# Patient Record
Sex: Male | Born: 1980 | Race: White | Hispanic: No | Marital: Single | Smoking: Never smoker
Health system: Southern US, Community
[De-identification: ages and names within clinical notes are randomized; demographics above are authoritative.]

## PROBLEM LIST (undated history)

## (undated) DIAGNOSIS — F1010 Alcohol abuse, uncomplicated: Secondary | ICD-10-CM

## (undated) DIAGNOSIS — R569 Unspecified convulsions: Secondary | ICD-10-CM

## (undated) HISTORY — PX: BACK SURGERY: SHX140

---

## 2013-05-03 ENCOUNTER — Emergency Department: Payer: Self-pay | Admitting: Emergency Medicine

## 2013-05-03 LAB — URINALYSIS, COMPLETE
Bacteria: NONE SEEN
Blood: NEGATIVE
Leukocyte Esterase: NEGATIVE
Protein: NEGATIVE
RBC,UR: <1 /HPF (ref 0–5)
Specific Gravity: 1.015 (ref 1.003–1.030)
Squamous Epithelial: NONE SEEN
WBC UR: 1 /HPF (ref 0–5)

## 2013-05-03 LAB — COMPREHENSIVE METABOLIC PANEL
Alkaline Phosphatase: 72 U/L (ref 50–136)
Co2: 23 mmol/L (ref 21–32)
Potassium: 3.5 mmol/L (ref 3.5–5.1)
SGOT(AST): 48 U/L — ABNORMAL HIGH (ref 15–37)
SGPT (ALT): 36 U/L (ref 12–78)
Sodium: 143 mmol/L (ref 136–145)

## 2013-05-03 LAB — DRUG SCREEN, URINE
Amphetamines, Ur Screen: NEGATIVE (ref ?–1000)
Benzodiazepine, Ur Scrn: NEGATIVE (ref ?–200)
Cocaine Metabolite,Ur ~~LOC~~: NEGATIVE (ref ?–300)
MDMA (Ecstasy)Ur Screen: NEGATIVE (ref ?–500)
Opiate, Ur Screen: NEGATIVE (ref ?–300)
Phencyclidine (PCP) Ur S: NEGATIVE (ref ?–25)
Tricyclic, Ur Screen: NEGATIVE (ref ?–1000)

## 2013-05-03 LAB — CBC
HCT: 42.7 % (ref 40.0–52.0)
HGB: 14.5 g/dL (ref 13.0–18.0)
MCHC: 33.9 g/dL (ref 32.0–36.0)
Platelet: 179 10*3/uL (ref 150–440)

## 2013-05-03 LAB — TSH: Thyroid Stimulating Horm: 1.82 u[IU]/mL

## 2013-05-03 LAB — ETHANOL: Ethanol: 385 mg/dL

## 2013-05-04 ENCOUNTER — Emergency Department: Payer: Self-pay | Admitting: Emergency Medicine

## 2013-05-04 LAB — COMPREHENSIVE METABOLIC PANEL
Anion Gap: 9 (ref 7–16)
BUN: 12 mg/dL (ref 7–18)
Calcium, Total: 7.6 mg/dL — ABNORMAL LOW (ref 8.5–10.1)
Chloride: 111 mmol/L — ABNORMAL HIGH (ref 98–107)
Co2: 25 mmol/L (ref 21–32)
Creatinine: 0.77 mg/dL (ref 0.60–1.30)
EGFR (African American): >60
EGFR (Non-African Amer.): >60
Glucose: 111 mg/dL — ABNORMAL HIGH (ref 65–99)
Osmolality: 289 (ref 275–301)
Potassium: 3.6 mmol/L (ref 3.5–5.1)
SGOT(AST): 54 U/L — ABNORMAL HIGH (ref 15–37)
Total Protein: 7 g/dL (ref 6.4–8.2)

## 2013-05-04 LAB — DRUG SCREEN, URINE
Benzodiazepine, Ur Scrn: NEGATIVE (ref ?–200)
Cocaine Metabolite,Ur ~~LOC~~: NEGATIVE (ref ?–300)
Opiate, Ur Screen: NEGATIVE (ref ?–300)

## 2013-05-04 LAB — CBC
MCH: 30.2 pg (ref 26.0–34.0)
Platelet: 136 10*3/uL — ABNORMAL LOW (ref 150–440)
RBC: 4.58 10*6/uL (ref 4.40–5.90)
RDW: 14.7 % — ABNORMAL HIGH (ref 11.5–14.5)

## 2013-05-04 LAB — URINALYSIS, COMPLETE
Bacteria: NONE SEEN
Bilirubin,UR: NEGATIVE
Glucose,UR: NEGATIVE mg/dL (ref 0–75)
Ketone: NEGATIVE
Nitrite: NEGATIVE
Squamous Epithelial: NONE SEEN
WBC UR: NONE SEEN /HPF (ref 0–5)

## 2013-05-04 LAB — TSH: Thyroid Stimulating Horm: 2.24 u[IU]/mL

## 2013-05-04 LAB — ETHANOL: Ethanol: 497 mg/dL

## 2013-05-05 LAB — ETHANOL: Ethanol: 297 mg/dL

## 2015-04-19 NOTE — Consult Note (Signed)
PATIENT NAME:  Salomon MastCHEEK, Vane R MR#:  [id number] DATE OF BIRTH:  [birth year]  AGE:  34 years  SEX:  Male  RACE:  White  DATE OF CONSULTATION:  05/06/2013  PLACE OF DICTATION:  [facility] ED, [place]  CONSULTING PHYSICIAN:  Heatherly Stenner K. Rosilyn Coachman, MD  SUBJECTIVE:  The patient was seen in [facility]. The patient is a 34 year old white male, not employed at this time. He is single, and lives with family members and with parents. The patient came on self-recommendation to [facility] Emergency Room with the chief complaint "I want to get over my substance abuse problem, I have been drinking alcohol to get over the pain in my back."   HISTORY OF PRESENT ILLNESS:  The patient reports that he has been having pain in the back, for which he underwent spinal fusion at [facility] in 2008, and still has chronic pain. He has been drinking alcoher the same. The patient reports he has been drinking at the rate of 3 to 4 mixed drinks twice a week to get over the same. On the day of admission, he drank more of the same, which was  "pure alcohol" and that is way his BAL was 497 when he came to the Emergency Room. The patient reports that he is a veteran, and he needs to [facility] [place] [facility] [place] [facility] [place], and is not able to make it because of the distance.    MENTAL STATUS:  Alert and oriented, aware of the situation that brought him to [facility]. Admits feeling low, down, depressed because of his chronic pain in the back, and uses alcohol as a self-prescribed drug. Denies feeling hopeless/helpless. Denies any ideas or plans to hurt himself or others. No psychosis. Denies auditory and visual hallucinations. Denies any ideas  but wants to get help with alcohol dependence and problems related to the same. ioor paln is intact. Insight and judgment guarded.  IMPRESSION:  Alcohol dependence, chronic, continuous. Drinks about 3 to 4 mixed drinks twice a week for more than a month. 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   RECOMMENDATION:  Put patient on CIWA and follow him. 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 The patient is to call the [place] on Monday, 05/08/2013, for appropriate followup and help.   ____________________________ Jannet MantisSurya K. Guss Bundehalla, MD skc:mr D: 05/06/2013 23:27:22 ET T: 05/07/2013 17:50:08 ET JOB#: [id number]  cc: Monika SalkSurya K. Guss [place]unde[place], MD, <Dictator> Beau FannySURYA K Christinamarie Tall MD ELECTRONICALLY SIGNED 05/07/2013 20:02

## 2016-03-12 ENCOUNTER — Emergency Department
Admission: EM | Admit: 2016-03-12 | Discharge: 2016-03-12 | Disposition: A | Discharge status: Home / Self Care | Payer: Self-pay | Attending: Emergency Medicine | Admitting: Emergency Medicine

## 2016-03-12 ENCOUNTER — Encounter: Payer: Self-pay | Admitting: Emergency Medicine

## 2016-03-12 DIAGNOSIS — G4700 Insomnia, unspecified: Secondary | ICD-10-CM | POA: Insufficient documentation

## 2016-03-12 DIAGNOSIS — F419 Anxiety disorder, unspecified: Secondary | ICD-10-CM | POA: Diagnosis present

## 2016-03-12 DIAGNOSIS — F329 Major depressive disorder, single episode, unspecified: Secondary | ICD-10-CM | POA: Insufficient documentation

## 2016-03-12 DIAGNOSIS — M549 Dorsalgia, unspecified: Secondary | ICD-10-CM | POA: Insufficient documentation

## 2016-03-12 DIAGNOSIS — F1010 Alcohol abuse, uncomplicated: Secondary | ICD-10-CM | POA: Diagnosis present

## 2016-03-12 DIAGNOSIS — G40909 Epilepsy, unspecified, not intractable, without status epilepticus: Secondary | ICD-10-CM | POA: Insufficient documentation

## 2016-03-12 DIAGNOSIS — R110 Nausea: Secondary | ICD-10-CM | POA: Insufficient documentation

## 2016-03-12 HISTORY — DX: Unspecified convulsions: R56.9

## 2016-03-12 HISTORY — DX: Alcohol abuse, uncomplicated: F10.10

## 2016-03-12 LAB — COMPREHENSIVE METABOLIC PANEL
ALT: 11 U/L — ABNORMAL LOW (ref 17–63)
ANION GAP: 12 (ref 5–15)
AST: 16 U/L (ref 15–41)
Albumin: 5.2 g/dL — ABNORMAL HIGH (ref 3.5–5.0)
Alkaline Phosphatase: 65 U/L (ref 38–126)
BILIRUBIN TOTAL: 0.7 mg/dL (ref 0.3–1.2)
BUN: 12 mg/dL (ref 6–20)
CALCIUM: 9.3 mg/dL (ref 8.9–10.3)
CO2: 25 mmol/L (ref 22–32)
Chloride: 102 mmol/L (ref 101–111)
Creatinine, Ser: 1.39 mg/dL — ABNORMAL HIGH (ref 0.61–1.24)
GFR calc Af Amer: >60 mL/min (ref >60)
Glucose, Bld: 125 mg/dL — ABNORMAL HIGH (ref 65–99)
Potassium: 3.2 mmol/L — ABNORMAL LOW (ref 3.5–5.1)
Sodium: 139 mmol/L (ref 135–145)
TOTAL PROTEIN: 8.4 g/dL — AB (ref 6.5–8.1)

## 2016-03-12 LAB — URINE DRUG SCREEN, QUALITATIVE (ARMC ONLY)
AMPHETAMINES, UR SCREEN: NOT DETECTED
BENZODIAZEPINE, UR SCRN: NOT DETECTED
Barbiturates, Ur Screen: NOT DETECTED
Cannabinoid 50 Ng, Ur ~~LOC~~: NOT DETECTED
Cocaine Metabolite,Ur ~~LOC~~: NOT DETECTED
MDMA (ECSTASY) UR SCREEN: NOT DETECTED
Methadone Scn, Ur: NOT DETECTED
Opiate, Ur Screen: NOT DETECTED
PHENCYCLIDINE (PCP) UR S: NOT DETECTED
TRICYCLIC, UR SCREEN: NOT DETECTED

## 2016-03-12 LAB — CBC
HCT: 43.8 % (ref 40.0–52.0)
HEMOGLOBIN: 14.9 g/dL (ref 13.0–18.0)
MCH: 29.2 pg (ref 26.0–34.0)
MCHC: 34.1 g/dL (ref 32.0–36.0)
MCV: 85.6 fL (ref 80.0–100.0)
PLATELETS: 235 10*3/uL (ref 150–440)
RBC: 5.12 MIL/uL (ref 4.40–5.90)
RDW: 13.5 % (ref 11.5–14.5)
WBC: 11 10*3/uL — ABNORMAL HIGH (ref 3.8–10.6)

## 2016-03-12 LAB — ETHANOL

## 2016-03-12 MED ORDER — POTASSIUM CHLORIDE CRYS ER 20 MEQ PO TBCR
20.0000 meq | EXTENDED_RELEASE_TABLET | Freq: Once | ORAL | Status: AC
  Administered 2016-03-12: 20 meq via ORAL
  Filled 2016-03-12: qty 1

## 2016-03-12 NOTE — ED Provider Notes (Signed)
Time Seen: Approximately 1340 I have reviewed the triage notes  Chief Complaint: Ingestion and Headache   History of Present Illness: Johnny Burgess is a 35 y.o. male who presents here requesting alcohol treatment. Patient states he's had a history of alcohol abuse and had been sober for 6 months. He states he was feeling depressed last night and drank 2 bottles of rubbing alcohol. The patient states he "" just wants to feel better "". He denies any suicidal thoughts, homicidal thoughts, hallucinations. Patient has no physical complaints at this time other than generalized fatigue.   Past Medical History  Diagnosis Date  . Alcohol abuse   . Seizures (HCC)     There are no active problems to display for this patient.   Past Surgical History  Procedure Laterality Date  . Back surgery      steel rod placed    Past Surgical History  Procedure Laterality Date  . Back surgery      steel rod placed    No current outpatient prescriptions on file.  Allergies:  Review of patient's allergies indicates no known allergies.  Family History: No family history on file.  Social History: Social History  Substance Use Topics  . Smoking status: Never Smoker   . Smokeless tobacco: Never Used  . Alcohol Use: No     Comment: sober x 6 months (july 2016)     Review of Systems:   10 point review of systems was performed and was otherwise negative:  Constitutional: No fever Eyes: No visual disturbances ENT: No sore throat, ear pain Cardiac: No chest pain Respiratory: No shortness of breath, wheezing, or stridor Abdomen: No abdominal pain, no vomiting, No diarrhea Endocrine: No weight loss, No night sweats Extremities: No peripheral edema, cyanosis Skin: No rashes, easy bruising Neurologic: No focal weakness, trouble with speech or swollowing Urologic: No dysuria, Hematuria, or urinary frequency   Physical Exam:  ED Triage Vitals  Enc Vitals Group     BP 03/12/16 1320  128/68 mmHg     Pulse Rate 03/12/16 1320 102     Resp 03/12/16 1320 18     Temp 03/12/16 1320 97.8 F (36.6 C)     Temp Source 03/12/16 1320 Oral     SpO2 03/12/16 1320 99 %     Weight 03/12/16 1320 165 lb (74.844 kg)     Height 03/12/16 1320 5\' 10"  (1.778 m)     Head Cir --      Peak Flow --      Pain Score 03/12/16 1321 7     Pain Loc --      Pain Edu? --      Excl. in GC? --     General: Awake , Alert , and Oriented times 3; GCS 15 Head: Normal cephalic , atraumatic Eyes: Pupils equal , round, reactive to light Nose/Throat: No nasal drainage, patent upper airway without erythema or exudate.  Neck: Supple, Full range of motion, No anterior adenopathy or palpable thyroid masses Lungs: Clear to ascultation without wheezes , rhonchi, or rales Heart: Regular rate, regular rhythm without murmurs , gallops , or rubs Abdomen: Soft, non tender without rebound, guarding , or rigidity; bowel sounds positive and symmetric in all 4 quadrants. No organomegaly .        Extremities: 2 plus symmetric pulses. No edema, clubbing or cyanosis Neurologic: normal ambulation, Motor symmetric without deficits, sensory intact Skin: warm, dry, no rashes   Labs:   All laboratory  work was reviewed including any pertinent negatives or positives listed below:  Labs Reviewed  COMPREHENSIVE METABOLIC PANEL - Abnormal; Notable for the following:    Potassium 3.2 (*)    Glucose, Bld 125 (*)    Creatinine, Ser 1.39 (*)    Total Protein 8.4 (*)    Albumin 5.2 (*)    ALT 11 (*)    All other components within normal limits  CBC - Abnormal; Notable for the following:    WBC 11.0 (*)    All other components within normal limits  ETHANOL  URINE DRUG SCREEN, QUALITATIVE ([facility] ONLY)   patient's dehydrated but otherwise of. His 2. Otherwise well in appearance and is not acidotic at this time.    ED Course:  Patient's stay here was uneventful and he was given some potassium here for some mild hypokalemia.  He was able to eat and drink and doesn't have any withdrawal symptoms at this time. He has no physical complaints of abdominal pain, etc. I felt he was medically cleared and TTS consult was arranged   Assessment:  Alcohol abuse     Plan: * Outpatient management after TTS assessment           Jennye Moccasin, MD 03/12/16 1545

## 2016-03-12 NOTE — ED Notes (Signed)
Supper provided  Pt observed with no unusual behavior  Appropriate to stimulation  No verbalized needs or concerns at this time  NAD assessed  Continue to monitor 

## 2016-03-12 NOTE — ED Notes (Signed)
States drank some rubbing alcohol last night around 2200 to try to "get a buzz".  Today c/o headache and back pain.  Patient states "I just want to feel better".  Patient states he has been sober from alcohol for 6 months and states he was feeling depressed last night.  Denies SI/ HI.

## 2016-03-12 NOTE — BH Assessment (Signed)
Assessment Note  Johnny Burgess is an 35 y.o. male. Who presents to the ED voluntarily seeking assistance with depressive symptoms. Pt states that he was feeling depressed last night and  impulsively drank 16oz of rubbing alcohol. Pt reports that he had no intention of harming himself. 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  Pt states that after drinking for 13 years he stopped drinking and has been sober from alcohol for 6 months. Pt denies any previous MH or SA inpatient treatment. Pt has taking medication in the past which he admits were helpful. Pt states [facility] states that he last seen the psychiatrist there about thirty days ago. Pt states that he is not currently taking any medications. Pt has complaints of experiencing several depressive symptoms for approximately 3 weeks now and reports that he has had depressive sx in the past. Pt states that he is usure as to why is his depressed, as he could not identify any recent stressful life events. Pt. denies any suicidal ideation, plan or intent. Pt. denies the presence of any auditory or visual hallucinations at this time. Patient denies any other medical complaints.   Diagnosis: Major Depression   Past Medical History:  Past Medical History  Diagnosis Date  . Alcohol abuse   . Seizures [facility])     Past Surgical History  Procedure Laterality Date  . Back surgery      steel rod placed    Family History: No family history on file.  Social History:  reports that he has never smoked. He has never used smokeless tobacco. He reports that he does not drink alcohol or use illicit drugs.  Additional Social History:  Alcohol / Drug Use Pain Medications: See PTA  Prescriptions: See PTA  Over the Counter: See PTA  History of  alcohol / drug use?: Yes Longest period of sobriety (when/how long): 6 months Negative Consequences of Use:  (N/A) Withdrawal Symptoms:  (N/A) Substance #1 Name of Substance 1: Alcohol 1 - Age of First Use: 35 y.o. 1 - Amount (size/oz): Unknown  1 - Frequency: Daily  1 - Duration: Unknown  1 - Last Use / Amount: x6 months ago   CIWA: CIWA-Ar BP: 128/68 mmHg Pulse Rate: (!) 102 COWS:    Allergies: No Known Allergies  Home Medications:  (Not in a hospital admission)  OB/GYN Status:  No LMP for male patient.  General Assessment Data Location of Assessment: [facility] ED TTS Assessment: In system Is this a Tele or Face-to-Face Assessment?: Face-to-Face Is this an Initial Assessment or a Re-assessment for this encounter?: Initial Assessment Marital status: Single Is patient pregnant?: No Pregnancy Status: No Living Arrangements: Parent Can pt return to current living arrangement?: Yes Admission Status: Voluntary Is patient capable of signing voluntary admission?: Yes Referral Source: Self/Family/Friend Insurance type: None Reported (None )  Medical Screening Exam [facility] Walk-in ONLY) Medical Exam completed: Yes  Crisis Care Plan Living Arrangements: Parent Legal Guardian: Other: (None Reproted) Name of Psychiatrist: @ the [place]  Name of Therapist: None Reported   Education Status Is patient currently in school?: No Highest grade of school patient has completed: 59 Name of school: N/A Contact person: N/A  Risk to self with the past 6 months Suicidal Ideation: No Has  patient been a risk to self within the past 6 months prior to admission? : No Suicidal Intent: No Has patient had any suicidal intent within the past 6 months prior to admission? : No Is patient at risk for suicide?: No Suicidal Plan?: No Has patient had any suicidal plan within the past 6 months prior to admission? : No Access to Means: No What has been your use of drugs/alcohol within the last 12 months?:  Hx of Alcohol, last drink x276months Previous Attempts/Gestures: No How many times?: 0 Other Self Harm Risks: N/A Triggers for Past Attempts: Other (Comment) Intentional Self Injurious Behavior: None Family Suicide History: No Recent stressful life event(s): Other (Comment) (None Reported) Persecutory voices/beliefs?: No Depression: Yes Depression Symptoms: Despondent, Isolating, Fatigue Substance abuse history and/or treatment for substance abuse?: Yes Suicide prevention information given to non-admitted patients: Not applicable  Risk to Others within the past 6 months Homicidal Ideation: No Does patient have any lifetime risk of violence toward others beyond the six months prior to admission? : No Thoughts of Harm to Others: No Current Homicidal Intent: No Current Homicidal Plan: No Access to Homicidal Means: No Identified Victim: N/A History of harm to others?: No Assessment of Violence: None Noted Violent Behavior Description: None Reported Does patient have access to weapons?: No Criminal Charges Pending?: No Does patient have a court date: No Is patient on probation?: No  Psychosis Hallucinations: None noted Delusions: None noted  Mental Status Report Appearance/Hygiene: In scrubs Eye Contact: Fair Motor Activity: Freedom of movement Speech: Logical/coherent Level of Consciousness: Quiet/awake Mood: Ambivalent Affect: Blunted Anxiety Level: None Thought Processes: Coherent Judgement: Impaired Orientation: Person, Place, Time, Situation Obsessive Compulsive Thoughts/Behaviors: None  Cognitive Functioning Concentration: Fair Memory: Remote Intact, Recent Intact IQ: Average Insight: Fair Impulse Control: Poor Appetite: Poor Weight Loss: 0 Weight Gain: 0 Sleep: No Change Total Hours of Sleep: 6 Vegetative Symptoms: Staying in bed (per pt report)  ADLScreening [facility] Assessment Services) Patient's cognitive ability adequate to safely complete daily  activities?: Yes Patient able to express need for assistance with ADLs?: Yes Independently performs ADLs?: Yes (appropriate for developmental age)  Prior Inpatient Therapy Prior Inpatient Therapy: No Prior Therapy Dates: N/A Prior Therapy Facilty/Provider(s): N/A Reason for Treatment: N/A  Prior Outpatient Therapy Prior Outpatient Therapy: Yes Prior Therapy Dates: [place]  [facility] Reason for Treatment: Anxiety  Does patient have an ACCT team?: No Does patient have Intensive In-House Services?  : No Does patient have [facility] services? : No Does patient have P4CC services?: No  ADL Screening (condition at time of admission) Patient's cognitive ability adequate to safely complete daily activities?: Yes Patient able to express need for assistance with ADLs?: Yes Independently performs ADLs?: Yes (appropriate for developmental age)       Abuse/Neglect Assessment (Assessment to be complete while patient is alone) Physical Abuse: Denies Verbal Abuse: Denies Sexual Abuse: Denies Exploitation of patient/patient's resources: Denies Self-Neglect: Denies Values / Beliefs Cultural Requests During Hospitalization: None Spiritual Requests During Hospitalization: None Consults Spiritual Care Consult Needed: No Social Work Consult Needed: No Merchant navy officerAdvance Directives (For Healthcare) Does patient have an advance directive?: No Would patient like information on creating an advanced directive?: Yes English as a second language teacher- Educational materials given    Additional Information 1:1 In Past 12 Months?: No CIRT Risk: No Elopement Risk: No Does patient have medical clearance?: Yes     Disposition:  Disposition Initial Assessment Completed for this Encounter: Yes Disposition of Patient: Referred to (Consult with Psych MD. )  On Site Evaluation by:   Reviewed with Physician:    Asa Saunas 03/12/2016 5:35 PM

## 2016-03-12 NOTE — ED Notes (Signed)

## 2016-03-12 NOTE — Consult Note (Signed)
[facility] Face-to-Face Psychiatry Consult   Reason for Consult:  Consult for this 35 year old man came to the emergency room after drinking rubbing alcohol last night. Concern about mood and substance abuse Referring Physician:  Marcelene Butte Patient Identification: Johnny Burgess MRN:  [id number] Principal Diagnosis: Alcohol abuse Diagnosis:   Patient Active Problem List   Diagnosis Date Noted  . Alcohol abuse [F10.10] 03/12/2016  . Anxiety disorder [F41.9] 03/12/2016    Total Time spent with patient: 45 minutes  Subjective:   Johnny Burgess is a 35 y.o. male patient admitted with "I drank some rubbing alcohol".  HPI:  Patient interviewed. Chart reviewed. Case discussed with TTS and emergency room doctor. Labs reviewed. This 35 year old man states that last night he consumed 2 glasses of rubbing alcohol. He did this because he had rubbing alcohol on hand for his back pain and did not feel like he could go out of the house to go find anything else. He said that he had been sober for 6 months at that point. His only reason for relapsing is that he is been feeling more anxious and depressed. He is very vague about that saying he is not sure how long he is been feeling more depressed. He mostly focuses on his chronic back pain. He says that he doesn't sleep very well at night. Hasn't been eating very well. Completely denies any suicidal thoughts. Says there was absolutely no thought of harming himself on drinking the rubbing alcohol no suicidal ideation at all. He is chronically anxious in a vague sort of way. He is not getting any kind of substance abuse or mental health treatment whatsoever. 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 Patient denies hallucinations or psychotic symptoms. Denies that he's been abusing any other drugs.  Substance abuse history: Multiple prior visits to the emergency room intoxicated first alcohol abuse. Despite this he says he has never been in any  kind of substance abuse treatment. Never been to any rehabilitation never been engaged in any outpatient substance abuse treatment. Denies that he uses any other drugs.  Medical history: He says he has chronic back pain that started when he was in the [employer]. No other medical problems. Doesn't get any medical treatment and is not on any medication.  Social history: Lives by himself. Does not work. Has very little to do during the day. He is thinking about going back to college but for the time being is doing nothing. As have some family support from his mother.  Past Psychiatric History: Patient denies any past history of suicide attempts denies any history of treatment for depression or anxiety. No inpatient hospital treatment.  Risk to Self: Suicidal Ideation: No Suicidal Intent: No Is patient at risk for suicide?: No Suicidal Plan?: No Access to Means: No What has been your use of drugs/alcohol within the last 12 months?: Hx of Alcohol, last drink x76month How many times?: 0 Other Self Harm Risks: N/A Triggers for Past Attempts: Other (Comment) Intentional Self Injurious Behavior: None Risk to Others: Homicidal Ideation: No Thoughts of Harm to Others: No Current Homicidal Intent: No Current Homicidal Plan: No Access to Homicidal Means: No Identified Victim: N/A History of harm to others?: No Assessment of Violence: None Noted Violent Behavior Description: None Reported Does patient have access to weapons?: No Criminal Charges Pending?: No Does patient have a court date: No Prior Inpatient Therapy: Prior Inpatient Therapy: No Prior Therapy Dates: N/A Prior Therapy Facilty/Provider(s): N/A Reason for  Treatment: N/A Prior Outpatient Therapy: Prior Outpatient Therapy: Yes Prior Therapy Dates: [place]  [facility] Reason for Treatment: Anxiety  Does patient have an ACCT team?: No Does patient have Intensive In-House Services?  : No Does patient have  [facility] services? : No Does patient have P4CC services?: No  Past Medical History:  Past Medical History  Diagnosis Date  . Alcohol abuse   . Seizures [facility])     Past Surgical History  Procedure Laterality Date  . Back surgery      steel rod placed   Family History: No family history on file. Family Psychiatric  History: Patient denies knowing of any family history of mental health problems Social History:  History  Alcohol Use No    Comment: sober x 6 months (july 2016)     History  Drug Use No    Social History   Social History  . Marital Status: Single    Spouse Name: N/A  . Number of Children: N/A  . Years of Education: N/A   Social History Main Topics  . Smoking status: Never Smoker   . Smokeless tobacco: Never Used  . Alcohol Use: No     Comment: sober x 6 months (july 2016)  . Drug Use: No  . Sexual Activity: Not on file   Other Topics Concern  . Not on file   Social History Narrative  . No narrative on file   Additional Social History:    Allergies:  No Known Allergies  Labs:  Results for orders placed or performed during the hospital encounter of 03/12/16 (from the past 48 hour(s))  Comprehensive metabolic panel     Status: Abnormal   Collection Time: 03/12/16  1:30 PM  Result Value Ref Range   Sodium 139 135 - 145 mmol/L   Potassium 3.2 (L) 3.5 - 5.1 mmol/L   Chloride 102 101 - 111 mmol/L   CO2 25 22 - 32 mmol/L   Glucose, Bld 125 (H) 65 - 99 mg/dL   BUN 12 6 - 20 mg/dL   Creatinine, Ser 1.39 (H) 0.61 - 1.24 mg/dL   Calcium 9.3 8.9 - 10.3 mg/dL   Total Protein 8.4 (H) 6.5 - 8.1 g/dL   Albumin 5.2 (H) 3.5 - 5.0 g/dL   AST 16 15 - 41 U/L   ALT 11 (L) 17 - 63 U/L   Alkaline Phosphatase 65 38 - 126 U/L   Total Bilirubin 0.7 0.3 - 1.2 mg/dL   GFR calc non Af Amer >60 >60 mL/min   GFR calc Af Amer >60 >60 mL/min    Comment: (NOTE) The eGFR has been calculated using the CKD EPI equation. This calculation has not been validated in all  clinical situations. eGFR's persistently <60 mL/min signify possible Chronic Kidney Disease.    Anion gap 12 5 - 15  Ethanol (ETOH)     Status: None   Collection Time: 03/12/16  1:30 PM  Result Value Ref Range   Alcohol, Ethyl (B) <5 <5 mg/dL    Comment:        LOWEST DETECTABLE LIMIT FOR SERUM ALCOHOL IS 5 mg/dL FOR MEDICAL PURPOSES ONLY   CBC     Status: Abnormal   Collection Time: 03/12/16  1:30 PM  Result Value Ref Range   WBC 11.0 (H) 3.8 - 10.6 K/uL   RBC 5.12 4.40 - 5.90 MIL/uL   Hemoglobin 14.9 13.0 - 18.0 g/dL   HCT 43.8 40.0 - 52.0 %  MCV 85.6 80.0 - 100.0 fL   MCH 29.2 26.0 - 34.0 pg   MCHC 34.1 32.0 - 36.0 g/dL   RDW 13.5 11.5 - 14.5 %   Platelets 235 150 - 440 K/uL  Urine Drug Screen, Qualitative ([facility] only)     Status: None   Collection Time: 03/12/16  1:30 PM  Result Value Ref Range   Tricyclic, Ur Screen NONE DETECTED NONE DETECTED   Amphetamines, Ur Screen NONE DETECTED NONE DETECTED   MDMA (Ecstasy)Ur Screen NONE DETECTED NONE DETECTED   Cocaine Metabolite,Ur [place] NONE DETECTED NONE DETECTED   Opiate, Ur Screen NONE DETECTED NONE DETECTED   Phencyclidine (PCP) Ur S NONE DETECTED NONE DETECTED   Cannabinoid 50 Ng, Ur [place] NONE DETECTED NONE DETECTED   Barbiturates, Ur Screen NONE DETECTED NONE DETECTED   Benzodiazepine, Ur Scrn NONE DETECTED NONE DETECTED   Methadone Scn, Ur NONE DETECTED NONE DETECTED    Comment: (NOTE) 585  Tricyclics, urine               Cutoff 1000 ng/mL 200  Amphetamines, urine             Cutoff 1000 ng/mL 300  MDMA (Ecstasy), urine           Cutoff 500 ng/mL 400  Cocaine Metabolite, urine       Cutoff 300 ng/mL 500  Opiate, urine                   Cutoff 300 ng/mL 600  Phencyclidine (PCP), urine      Cutoff 25 ng/mL 700  Cannabinoid, urine              Cutoff 50 ng/mL 800  Barbiturates, urine             Cutoff 200 ng/mL 900  Benzodiazepine, urine           Cutoff 200 ng/mL 1000 Methadone, urine                Cutoff 300  ng/mL 1100 1200 The urine drug screen provides only a preliminary, unconfirmed 1300 analytical test result and should not be used for non-medical 1400 purposes. Clinical consideration and professional judgment should 1500 be applied to any positive drug screen result due to possible 1600 interfering substances. A more specific alternate chemical method 1700 must be used in order to obtain a confirmed analytical result.  1800 Gas chromato graphy / mass spectrometry (GC/MS) is the preferred 1900 confirmatory method.     Current Facility-Administered Medications  Medication Dose Route Frequency Provider Last Rate Last Dose  . potassium chloride SA (K-DUR,KLOR-CON) CR tablet 20 mEq  20 mEq Oral Once Daymon Larsen, MD       No current outpatient prescriptions on file.    Musculoskeletal: Strength & Muscle Tone: within normal limits Gait & Station: normal Patient leans: N/A  Psychiatric Specialty Exam: Review of Systems  Constitutional: Positive for malaise/fatigue.  HENT: Negative.   Eyes: Negative.   Respiratory: Negative.   Cardiovascular: Negative.   Gastrointestinal: Positive for nausea.  Musculoskeletal: Positive for back pain.  Skin: Negative.   Neurological: Negative.   Psychiatric/Behavioral: Positive for depression and substance abuse. Negative for suicidal ideas, hallucinations and memory loss. The patient is nervous/anxious and has insomnia.     Blood pressure 128/68, pulse 102, temperature 97.8 F (36.6 C), temperature source Oral, resp. rate 18, height _0  (1.778 m), weight 74.844 kg (165 lb), SpO2 99 %.Body mass index is  23.68 kg/(m^2).  General Appearance: Fairly Groomed  Engineer, water::  Fair  Speech:  Slow  Volume:  Decreased  Mood:  Anxious  Affect:  Congruent  Thought Process:  Goal Directed  Orientation:  Full (Time, Place, and Person)  Thought Content:  Negative  Suicidal Thoughts:  No  Homicidal Thoughts:  No  Memory:  Immediate;   Good Recent;    Good Remote;   Good  Judgement:  Fair  Insight:  Shallow  Psychomotor Activity:  Decreased  Concentration:  Fair  Recall:  [employer] of Knowledge:Fair  Language: Fair  Akathisia:  No  Handed:  Right  AIMS (if indicated):     Assets:  Agricultural consultant Housing Physical Health Social Support  ADL's:  Intact  Cognition: WNL  Sleep:      Treatment Plan Summary: Plan 35 year old man who came into the hospital after ingesting rubbing alcohol but has repeatedly insisted that there was nothing suicidal about it only a desire to get intoxicated. Long history of alcohol abuse. Patient seems somewhat vague in distant about his anxiety and depression but there is no sign of delirium or confusion. No sign of acute dangerousness. No indication for involuntary commitment. Patient will be strongly encouraged to go to the [facility] or contact them for substance abuse treatment and evaluation of his anxiety. He also can be given referrals to [facility] locally. Educated and supportive therapy done. Case reviewed with the ER doctor. Patient can be discharged  Disposition: Patient does not meet criteria for psychiatric inpatient admission. Supportive therapy provided about ongoing stressors.  Alethia Berthold, MD 03/12/2016 6:25 PM

## 2016-03-12 NOTE — ED Notes (Signed)
Arvin CollardSheila Meade (Mother):  [phone]

## 2016-03-12 NOTE — ED Provider Notes (Signed)
-----------------------------------------   5:45 PM on 03/12/2016 -----------------------------------------  Patient has been seen by behavioral health nurse. Patient is requesting to speak to a psychiatrist. He does state depression but continues to adamantly deny any intention of hurting himself. He is recovering alcoholic and states with his increased depression he just wanted to drink alcohol and that was the only alcohol he had access to at the time. We will have psychiatry see the patient. He remains voluntary at this time, as he is calm, cooperative, does not appear intoxicated, denies any SI/HI.  Minna AntisKevin Joyceline Maiorino, MD 03/12/16 [id number]

## 2016-03-12 NOTE — ED Notes (Signed)
BEHAVIORAL HEALTH ROUNDING Patient sleeping: No. Patient alert and oriented: yes Behavior appropriate: Yes.  ; If no, describe:  Nutrition and fluids offered: yes Toileting and hygiene offered: Yes  Sitter present: q15 minute observations and security  monitoring Law enforcement present: Yes  ODS  

## 2016-03-12 NOTE — ED Notes (Signed)
BEHAVIORAL HEALTH ROUNDING Patient sleeping: No. Patient alert and oriented: yes Behavior appropriate: Yes.  ; If no, describe:  Nutrition and fluids offered: yes Toileting and hygiene offered: Yes  Sitter present: q15 minute observations and security monitoring Law enforcement present: Yes  ODS  Pt has been seen by psych [facility]  - pt to be discharged to home

## 2016-03-12 NOTE — Discharge Instructions (Signed)
Alcohol Use Disorder °Alcohol use disorder is a mental disorder. It is not a one-time incident of heavy drinking. Alcohol use disorder is the excessive and uncontrollable use of alcohol over time that leads to problems with functioning in one or more areas of daily living. People with this disorder risk harming themselves and others when they drink to excess. Alcohol use disorder also can cause other mental disorders, such as mood and anxiety disorders, and serious physical problems. People with alcohol use disorder often misuse other drugs.  °Alcohol use disorder is common and widespread. Some people with this disorder drink alcohol to cope with or escape from negative life events. Others drink to relieve chronic pain or symptoms of mental illness. People with a family history of alcohol use disorder are at higher risk of losing control and using alcohol to excess.  °Drinking too much alcohol can cause injury, accidents, and health problems. One drink can be too much when you are: °· Working. °· Pregnant or breastfeeding. °· Taking medicines. Ask your doctor. °· Driving or planning to drive. °SYMPTOMS  °Signs and symptoms of alcohol use disorder may include the following:  °· Consumption of alcohol in larger amounts or over a longer period of time than intended. °· Multiple unsuccessful attempts to cut down or control alcohol use.   °· A great deal of time spent obtaining alcohol, using alcohol, or recovering from the effects of alcohol (hangover). °· A strong desire or urge to use alcohol (cravings).   °· Continued use of alcohol despite problems at work, school, or home because of alcohol use.   °· Continued use of alcohol despite problems in relationships because of alcohol use. °· Continued use of alcohol in situations when it is physically hazardous, such as driving a car. °· Continued use of alcohol despite awareness of a physical or psychological problem that is likely related to alcohol use. Physical  problems related to alcohol use can involve the brain, heart, liver, stomach, and intestines. Psychological problems related to alcohol use include intoxication, depression, anxiety, psychosis, delirium, and dementia.   °· The need for increased amounts of alcohol to achieve the same desired effect, or a decreased effect from the consumption of the same amount of alcohol (tolerance). °· Withdrawal symptoms upon reducing or stopping alcohol use, or alcohol use to reduce or avoid withdrawal symptoms. Withdrawal symptoms include: °¨ Racing heart. °¨ Hand tremor. °¨ Difficulty sleeping. °¨ Nausea. °¨ Vomiting. °¨ Hallucinations. °¨ Restlessness. °¨ Seizures. °DIAGNOSIS °Alcohol use disorder is diagnosed through an assessment by your health care provider. Your health care provider may start by asking three or four questions to screen for excessive or problematic alcohol use. To confirm a diagnosis of alcohol use disorder, at least two symptoms must be present within a 12-month period. The severity of alcohol use disorder depends on the number of symptoms: °· Mild--two or three. °· Moderate--four or five. °· Severe--six or more. °Your health care provider may perform a physical exam or use results from lab tests to see if you have physical problems resulting from alcohol use. Your health care provider may refer you to a mental health professional for evaluation. °TREATMENT  °Some people with alcohol use disorder are able to reduce their alcohol use to low-risk levels. Some people with alcohol use disorder need to quit drinking alcohol. When necessary, mental health professionals with specialized training in substance use treatment can help. Your health care provider can help you decide how severe your alcohol use disorder is and what type of treatment you need.   The following forms of treatment are available:   Detoxification. Detoxification involves the use of prescription medicines to prevent alcohol withdrawal  symptoms in the first week after quitting. This is important for people with a history of symptoms of withdrawal and for heavy drinkers who are likely to have withdrawal symptoms. Alcohol withdrawal can be dangerous and, in severe cases, cause death. Detoxification is usually provided in a hospital or in-patient substance use treatment facility.  Counseling or talk therapy. Talk therapy is provided by substance use treatment counselors. It addresses the reasons people use alcohol and ways to keep them from drinking again. The goals of talk therapy are to help people with alcohol use disorder find healthy activities and ways to cope with life stress, to identify and avoid triggers for alcohol use, and to handle cravings, which can cause relapse.  Medicines.Different medicines can help treat alcohol use disorder through the following actions:  Decrease alcohol cravings.  Decrease the positive reward response felt from alcohol use.  Produce an uncomfortable physical reaction when alcohol is used (aversion therapy).  Support groups. Support groups are run by people who have quit drinking. They provide emotional support, advice, and guidance. These forms of treatment are often combined. Some people with alcohol use disorder benefit from intensive combination treatment provided by specialized substance use treatment centers. Both inpatient and outpatient treatment programs are available.   This information is not intended to replace advice given to you by your health care provider. Make sure you discuss any questions you have with your health care provider.   Document Released: 01/21/2005 Document Revised: 01/04/2015 Document Reviewed: 03/23/2013 Elsevier Interactive Patient Education [employer]2016 Elsevier Inc.  Please return immediately if condition worsens. Please contact her primary physician or the physician you were given for referral. If you have any specialist physicians involved in her treatment and  plan please also contact them. Thank you for using [facility].

## 2016-05-25 ENCOUNTER — Emergency Department
Admission: EM | Admit: 2016-05-25 | Discharge: 2016-05-26 | Disposition: A | Discharge status: Home / Self Care | Payer: Self-pay | Attending: Emergency Medicine | Admitting: Emergency Medicine

## 2016-05-25 ENCOUNTER — Encounter: Payer: Self-pay | Admitting: Emergency Medicine

## 2016-05-25 DIAGNOSIS — T1491 Suicide attempt: Secondary | ICD-10-CM | POA: Insufficient documentation

## 2016-05-25 DIAGNOSIS — F1010 Alcohol abuse, uncomplicated: Secondary | ICD-10-CM | POA: Diagnosis present

## 2016-05-25 DIAGNOSIS — Y658 Other specified misadventures during surgical and medical care: Secondary | ICD-10-CM | POA: Insufficient documentation

## 2016-05-25 DIAGNOSIS — F419 Anxiety disorder, unspecified: Secondary | ICD-10-CM | POA: Diagnosis present

## 2016-05-25 DIAGNOSIS — F1994 Other psychoactive substance use, unspecified with psychoactive substance-induced mood disorder: Secondary | ICD-10-CM

## 2016-05-25 DIAGNOSIS — Z046 Encounter for general psychiatric examination, requested by authority: Secondary | ICD-10-CM

## 2016-05-25 DIAGNOSIS — F10129 Alcohol abuse with intoxication, unspecified: Secondary | ICD-10-CM | POA: Insufficient documentation

## 2016-05-25 DIAGNOSIS — G40909 Epilepsy, unspecified, not intractable, without status epilepticus: Secondary | ICD-10-CM | POA: Insufficient documentation

## 2016-05-25 DIAGNOSIS — F10929 Alcohol use, unspecified with intoxication, unspecified: Secondary | ICD-10-CM

## 2016-05-25 DIAGNOSIS — T496X2A Poisoning by otorhinolaryngological drugs and preparations, intentional self-harm, initial encounter: Secondary | ICD-10-CM | POA: Insufficient documentation

## 2016-05-25 LAB — COMPREHENSIVE METABOLIC PANEL
ALK PHOS: 64 U/L (ref 38–126)
ALT: 59 U/L (ref 17–63)
ANION GAP: 13 (ref 5–15)
AST: 54 U/L — ABNORMAL HIGH (ref 15–41)
Albumin: 4.6 g/dL (ref 3.5–5.0)
BILIRUBIN TOTAL: 0.7 mg/dL (ref 0.3–1.2)
BUN: 8 mg/dL (ref 6–20)
CALCIUM: 8.5 mg/dL — AB (ref 8.9–10.3)
CO2: 25 mmol/L (ref 22–32)
Chloride: 104 mmol/L (ref 101–111)
Creatinine, Ser: 0.75 mg/dL (ref 0.61–1.24)
GFR calc non Af Amer: >60 mL/min (ref >60)
Glucose, Bld: 165 mg/dL — ABNORMAL HIGH (ref 65–99)
POTASSIUM: 2.9 mmol/L — AB (ref 3.5–5.1)
Sodium: 142 mmol/L (ref 135–145)
TOTAL PROTEIN: 7.7 g/dL (ref 6.5–8.1)

## 2016-05-25 LAB — CBC WITH DIFFERENTIAL/PLATELET
BASOS ABS: 0 10*3/uL (ref 0–0.1)
BASOS PCT: 1 %
EOS ABS: 0 10*3/uL (ref 0–0.7)
EOS PCT: 0 %
HCT: 46.6 % (ref 40.0–52.0)
Hemoglobin: 15.7 g/dL (ref 13.0–18.0)
Lymphocytes Relative: 34 %
Lymphs Abs: 1.7 10*3/uL (ref 1.0–3.6)
MCH: 29.8 pg (ref 26.0–34.0)
MCHC: 33.7 g/dL (ref 32.0–36.0)
MCV: 88.2 fL (ref 80.0–100.0)
MONO ABS: 0.4 10*3/uL (ref 0.2–1.0)
Monocytes Relative: 8 %
Neutro Abs: 2.9 10*3/uL (ref 1.4–6.5)
Neutrophils Relative %: 57 %
PLATELETS: 207 10*3/uL (ref 150–440)
RBC: 5.28 MIL/uL (ref 4.40–5.90)
RDW: 13.8 % (ref 11.5–14.5)
WBC: 5 10*3/uL (ref 3.8–10.6)

## 2016-05-25 LAB — SALICYLATE LEVEL

## 2016-05-25 LAB — ETHANOL: ALCOHOL ETHYL (B): 434 mg/dL — AB (ref <5)

## 2016-05-25 LAB — ACETAMINOPHEN LEVEL

## 2016-05-25 MED ORDER — SODIUM CHLORIDE 0.9 % IV BOLUS (SEPSIS)
1000.0000 mL | Freq: Once | INTRAVENOUS | Status: AC
  Administered 2016-05-25: 1000 mL via INTRAVENOUS

## 2016-05-25 NOTE — ED Provider Notes (Signed)
[facility] Emergency Department Provider Note  Time seen: 9:11 PM  I have reviewed the triage vital signs and the nursing notes.   HISTORY  Chief Complaint Drug Overdose    HPI Johnny Burgess is a 35 y.o. male with a past medical history of alcohol abuse, presents the emergency department after a possible seizure. According to EMS there were called with reports of a possible seizure with shaking. Upon their arrival the patient is awake and alert, no postictal period identified at least by EMS. Patient has significantly slurred speech is drowsy but able to answer questions however his answers are inconsistent. Patient admits to drinking a large amount of alcohol as well as drinking a bottle of mouthwash. When asked repeatedly why he drank the mouthwash he eventually says to commit suicide. Patient has no known medical issues. Patient was seen last in March 2017 in this hospital after drinking rubbing alcohol. The patient is a Cytogeneticist.     Past Medical History  Diagnosis Date  . Alcohol abuse   . Seizures [facility])     Patient Active Problem List   Diagnosis Date Noted  . Alcohol abuse 03/12/2016  . Anxiety disorder 03/12/2016    Past Surgical History  Procedure Laterality Date  . Back surgery      steel rod placed    No current outpatient prescriptions on file.  Allergies Review of patient's allergies indicates no known allergies.  History reviewed. No pertinent family history.  Social History Social History  Substance Use Topics  . Smoking status: Never Smoker   . Smokeless tobacco: Never Used  . Alcohol Use: No     Comment: sober x 6 months (july 2016)    Review of Systems Unable to obtain an accurate review of systems due to altered mental status/intoxication.  ____________________________________________   PHYSICAL EXAM:  Constitutional: Alert, patient is able to answer orientation questions after repeating the questions several  times. Patient will follow commands such as sitting up to change his shirt. Eyes: Normal exam, PERRL. ENT   Head: Normocephalic and atraumatic.   Mouth/Throat: Mucous membranes are moist. Cardiovascular: Normal rate, regular rhythm around 100 bpm. Respiratory: Normal respiratory effort without tachypnea nor retractions. Breath sounds are clear  Gastrointestinal: Soft and nontender. No distention.   Musculoskeletal: Nontender with normal range of motion in all extremities. Neurologic:  Normal speech and language. No gross focal neurologic deficits  Skin:  Skin is warm, dry and intact.  Psychiatric: Very slurred speech, inconsistent answers. Overall consistent with intoxication.  ____________________________________________    EKG  EKG reviewed and interpreted by myself shows sinus tachycardia 108 bpm, slightly widened QRS, normal axis, largely normal intervals, nonspecific ST changes are present.  ____________________________________________       INITIAL IMPRESSION / ASSESSMENT AND PLAN / ED COURSE  Pertinent labs & imaging results that were available during my care of the patient were reviewed by me and considered in my medical decision making (see chart for details).  Patient presents the emergency department for possible seizure-like activity at the gas station tonight. Patient smells of alcohol, appears intoxicated. Patient missed drinking a bottle of mouthwash in addition to beer and liquor. When asked specifically why he drank the mouthwash she states to commit suicide. Patient denies any other substance use tonight. We will check labs, EKG, placed the patient with a sitter, commit the patient to the hospital, and IV hydrate while awaiting results. Patient will be monitored via cardiac monitor throughout his ED  stay until medically cleared.  Patient's labs have resulted showing significant alcohol intoxication with an alcohol level of 434. Labs are otherwise within normal  limits. Patient is somnolent, but continues to awaken to verbal stimuli. We will continue to monitor the patient via cardiac monitor in the emergency department or the patient metabolizes his alcohol. We will continue the involuntary commitment until the patient can be evaluated by psychiatry once sober.  ____________________________________________   FINAL CLINICAL IMPRESSION(S) / ED DIAGNOSES  Alcohol intoxication Suicide attempt Overdose   Minna AntisKevin Ulice Follett, MD 05/25/16 [id number]

## 2016-05-25 NOTE — ED Notes (Addendum)
Pt arrived by EMS from [facility] with reports of possible overdose from mouthwash. EMS reports pt was seen by witnesses at [facility] having seizure like activity, EMS states no post ictal or seizure like activity witnessed. Upon arrival pt is slurred speech, drowsy, and answering No to every question asked.

## 2016-05-25 NOTE — BH Assessment (Signed)
Assessment Note  Johnny Burgess is an 35 y.o. male. Johnny Burgess arrived to the ED by way of EMS.  He states that he broke up with his girlfriend.  Johnny MastHe states that he dealt with it with alcohol.  He started drinking at home. He reports drinking vodka, he is unsure of the amount.  He reports  current symptoms of depression.  He denied symptoms of anxiety, Sharing that he drinks to deal with his anxiety.  He denied suicidal or homicidal ideation or intent.  He denied auditory or visual hallucinations. He denied telling the nurse that he wanted to kill himself. He says that he was just drinking enough to numb the pain. Nurse reports that Johnny Burgess arrived to ED from [place] with a possible overdose from mouthwash. He was originally quoted as attempting to commit suicide by overdosing on alcohol, but denied this claim to the TTS.  John[facility] Burg[facility] was [facility] March for drinking rubbing alcohol.  BAL = 434.  Diagnosis: Depression  Past Medical History:  Past Medical History  Diagnosis Date  . Alcohol abuse   . Seizures [facility](HCC)     Past Surgical History  Procedure Laterality Date  . Back surgery      steel rod placed    Family History: History reviewed. No pertinent family history.  Social History:  reports that he has never smoked. He has never used smokeless tobacco. He reports that he does not drink alcohol or use illicit drugs.  Additional Social History:  Alcohol / Drug Use History of alcohol / drug use?: Yes Substance #1 Name of Substance 1: Alcohol 1 - Age of First Use: 18 1 - Amount (size/oz): Unknown 1 - Frequency: 4-5 days a week 1 - Last Use / Amount: 05/25/2016  CIWA: CIWA-Ar BP: 108/80 mmHg Pulse Rate: (!) 105 COWS:    Allergies: No Known Allergies  Home Medications:  (Not in a hospital admission)  OB/GYN Status:  No LMP for male patient.  General Assessment Data [facility] TTS Assessment: In system Is this a Tele or Face-to-Face Assessment?:  Face-to-Face Is this an Initial Assessment or a Re-assessment for this encounter?: Initial Assessment Marital status: Single Maiden name: n/a Is patient pregnant?: No Pregnancy Status: No Living Arrangements: Parent Can pt return to current living arrangement?: Yes Is patient capable of signing voluntary admission?: Yes Referral Source: Self/Family/Friend Insurance type: None  Medical Screening Exam [facility] Walk-in ONLY) Medical Exam completed: Yes  Crisis Care Plan Living Arrangements: Parent Legal Guardian: Other: (Self) Name of Psychiatrist: Denied Name of Therapist: Denied  Education Status Is patient currently in school?: No Current Grade: n/a Highest grade of school patient has completed: Some college Name of school: [employer] Contact person: n/a  Risk to self with the past 6 months Suicidal Ideation: No Has patient been a risk to self within the past 6 months prior to admission? : No Suicidal Intent: No Has patient had any suicidal intent within the past 6 months prior to admission? : No Is patient at risk for suicide?: No Suicidal Plan?: No Has patient had any suicidal plan within the past 6 months prior to admission? : No Access to Means: No What has been your use of drugs/alcohol within the last 12 months?: Use of alcohol 4-5 days a week Previous Attempts/Gestures: No How many times?: 0 Other Self Harm Risks: denied Triggers for Past Attempts: None known Intentional Self Injurious Behavior: None Family Suicide History: No Recent stressful life  event(s): Conflict (Comment) (Broke up with girlfriend) Persecutory voices/beliefs?: No Depression: Yes Depression Symptoms: Despondent, Feeling worthless/self pity Substance abuse history and/or treatment for substance abuse?: Yes Suicide prevention information given to non-admitted patients: Not applicable  Risk to Others within the past 6 months Homicidal Ideation: No Does patient have any lifetime risk of violence  toward others beyond the six months prior to admission? : No Thoughts of Harm to Others: No Current Homicidal Intent: No Current Homicidal Plan: No Access to Homicidal Means: No Identified Victim: None identified History of harm to others?: No Assessment of Violence: None Noted Violent Behavior Description: None Does patient have access to weapons?: No Criminal Charges Pending?: No Does patient have a court date: No Is patient on probation?: No  Psychosis Hallucinations: None noted Delusions: None noted  Mental Status Report Appearance/Hygiene:  (intoxicated) Eye Contact: Poor Motor Activity: Unremarkable Speech: Slurred, Slow Level of Consciousness: Drowsy Mood: Depressed Affect: Flat Anxiety Level: None Thought Processes: Unable to Assess Judgement: Impaired Orientation: Place, Situation Obsessive Compulsive Thoughts/Behaviors: None  Cognitive Functioning Concentration: Poor Memory: Unable to Assess IQ: Average Insight: Poor Impulse Control: Poor Appetite: Poor Sleep: Decreased Vegetative Symptoms: None  ADLScreening [facility] Assessment [facility]) Patient's cognitive ability adequate to safely complete daily activities?: Yes Patient able to express need for assistance with ADLs?: Yes Independently performs ADLs?: Yes (appropriate for developmental age)  Prior Inpatient Therapy Prior Inpatient Therapy: No Prior Therapy Dates: n/a Prior Therapy Facilty/Provider(s): n./a Reason for Treatment: n/a  Prior Outpatient Therapy Prior Outpatient Therapy: No Prior Therapy Dates: n/a Prior Therapy Facilty/Provider(s): n/a Reason for Treatment: n/a Does patient have an ACCT team?: No Does patient have Intensive In-House Services?  : No Does patient have [facility] services? : No Does patient have P4CC services?: No  ADL Screening (condition at time of admission) Patient's cognitive ability adequate to safely complete daily activities?: Yes Patient able to express need  for assistance with ADLs?: Yes Independently performs ADLs?: Yes (appropriate for developmental age)       Abuse/Neglect Assessment (Assessment to be complete while patient is alone) Physical Abuse: Denies Verbal Abuse: Denies Sexual Abuse: Denies Exploitation of patient/patient's resources: Denies Self-Neglect: Denies     Merchant navy officer (For Healthcare) Does patient have an advance directive?: No Would patient like information on creating an advanced directive?: No - patient declined information    Additional Information 1:1 In Past 12 Months?: Yes CIRT Risk: No Elopement Risk: No Does patient have medical clearance?: No     Disposition:  Disposition Initial Assessment Completed for this Encounter: Yes Disposition of Patient: Other dispositions  On Site Evaluation by:   Reviewed with Physician:    Justice Deeds 05/25/2016 10:07 PM

## 2016-05-25 NOTE — ED Notes (Signed)
Pt sts that he had "2 drinks...licquor"  Pt unable/refuses to elaborate on amount of ETHO consumed. When questioned about why he had been drinking, pt stated "my back hurts".  Pt denied any SI/HI with this RN.  Pts speech slurred, pt unable to focus for lengths of time.

## 2016-05-26 DIAGNOSIS — Z046 Encounter for general psychiatric examination, requested by authority: Secondary | ICD-10-CM

## 2016-05-26 DIAGNOSIS — F1994 Other psychoactive substance use, unspecified with psychoactive substance-induced mood disorder: Secondary | ICD-10-CM

## 2016-05-26 LAB — URINE DRUG SCREEN, QUALITATIVE (ARMC ONLY)
AMPHETAMINES, UR SCREEN: NOT DETECTED
Barbiturates, Ur Screen: NOT DETECTED
Benzodiazepine, Ur Scrn: NOT DETECTED
COCAINE METABOLITE, UR ~~LOC~~: NOT DETECTED
Cannabinoid 50 Ng, Ur ~~LOC~~: NOT DETECTED
MDMA (ECSTASY) UR SCREEN: NOT DETECTED
Methadone Scn, Ur: NOT DETECTED
Opiate, Ur Screen: NOT DETECTED
Phencyclidine (PCP) Ur S: NOT DETECTED
TRICYCLIC, UR SCREEN: NOT DETECTED

## 2016-05-26 LAB — URINALYSIS COMPLETE WITH MICROSCOPIC (ARMC ONLY)
BACTERIA UA: NONE SEEN
Bilirubin Urine: NEGATIVE
GLUCOSE, UA: NEGATIVE mg/dL
Hgb urine dipstick: NEGATIVE
Ketones, ur: NEGATIVE mg/dL
Leukocytes, UA: NEGATIVE
Nitrite: NEGATIVE
PROTEIN: NEGATIVE mg/dL
Specific Gravity, Urine: 1.014 (ref 1.005–1.030)
pH: 5 (ref 5.0–8.0)

## 2016-05-26 MED ORDER — POTASSIUM CHLORIDE CRYS ER 20 MEQ PO TBCR
40.0000 meq | EXTENDED_RELEASE_TABLET | Freq: Once | ORAL | Status: AC
  Administered 2016-05-26: 40 meq via ORAL
  Filled 2016-05-26: qty 2

## 2016-05-26 NOTE — Discharge Instructions (Signed)
Stop drinking alcohol.  See your doctor and psychiatrist  Return to ER if you have thoughts of harming yourself or others, hallucinations, overdose

## 2016-05-26 NOTE — ED Notes (Signed)
Pt transferred from main ed , is pleasant and cooperative has no c/o appears in no distress was oriented to unit and has process of bhu explained he is aware he is waiting for Dr

## 2016-05-26 NOTE — ED Provider Notes (Signed)
-----------------------------------------   6:48 AM on 05/26/2016 -----------------------------------------   Blood pressure 120/88, pulse 82, temperature 97.9 F (36.6 C), temperature source Oral, resp. rate 14, height 5\' 10"  (1.778 m), weight 160 lb (72.576 kg), SpO2 95 %.  The patient had no acute events since last update.  He awoke approximately 5 AM; vital signs stable and was removed from the cardiac monitor. Calm and cooperative at this time.  Disposition is pending per Psychiatry/Behavioral Medicine team recommendations.     Irean HongJade J Sung, MD 05/26/16 [phone]

## 2016-05-26 NOTE — Consult Note (Signed)
[facility] Consult   Reason for Consult:  Consult for 35 year old man presented to the emergency room very intoxicated with concern about the side Referring Physician:  Darl Householder Patient Identification: Johnny Burgess MRN:  [id number] Principal Diagnosis: Substance induced mood disorder [facility]) Diagnosis:   Patient Active Problem List   Diagnosis Date Noted  . Substance induced mood disorder ([place]) [F19.94] 05/26/2016  . Involuntary commitment [Z04.6] 05/26/2016  . Alcohol abuse [F10.10] 03/12/2016  . Anxiety disorder [F41.9] 03/12/2016    Total Time spent with patient: 1 hour  Subjective:   Johnny Burgess is a 35 y.o. male patient admitted with "I've seen better days".  HPI:  Patient interviewed. Chart reviewed. Labs and vitals reviewed. Case discussed with emergency room physician. 35 year old man brought in last night after calling 911. He was extremely intoxicated. Blood alcohol level over 400. Patient says that he doesn't drink every day but binges about every 1-2 weeks. Yesterday he had a very large amount to drink. Not even sure that we can believe his memory of how much she was drinking. Denies that he was using other drugs. Apparently when he came in he had made some statements that might of been screwed as suicidal and there was some kind of concerned that he was drinking mouthwash. The patient claims to me that he was just drinking regular alcohol. He denies any suicidal ideation. Says that he's been feeling frustrated recently about his relationship with his girlfriend and the frustration leads him to drink more. Sleep is chronically poor. Appetite is chronically poor. Complains of chronic back pain.  Social history: Patient is a English as a second language teacher. Does not receive a benefits. Lives with his parents. Hasn't been working in quite a long time. Seems to be working on trying to get service-connected. Limited social activity. Says he's been together with this girlfriend since earlier  this year and it sounds like it's a relationship problems.  Medical history: Patient has no history known of seizures. Doubt that he's had a history of delirium tremens. No other known ongoing medical problems.  Substance abuse history: Long-standing problems with intermittent alcohol abuse. Patient says that he has gone to [employer] but doesn't sound like he stays at very actively involved. He has not been too rehabilitation and does not see a substance abuse counselor.  Past Psychiatric History: Denies any history of suicidal or homicidal behavior. Says he's been prescribed some medication in the past but is not currently on any psychiatric medicine. No history of suicide attempts. No history of psychiatric hospitalization  Risk to Self: Suicidal Ideation: No Suicidal Intent: No Is patient at risk for suicide?: No Suicidal Plan?: No Access to Means: No What has been your use of drugs/alcohol within the last 12 months?: Use of alcohol 4-5 days a week How many times?: 0 Other Self Harm Risks: denied Triggers for Past Attempts: None known Intentional Self Injurious Behavior: None Risk to Others: Homicidal Ideation: No Thoughts of Harm to Others: No Current Homicidal Intent: No Current Homicidal Plan: No Access to Homicidal Means: No Identified Victim: None identified History of harm to others?: No Assessment of Violence: None Noted Violent Behavior Description: None Does patient have access to weapons?: No Criminal Charges Pending?: No Does patient have a court date: No Prior Inpatient Therapy: Prior Inpatient Therapy: No Prior Therapy Dates: n/a Prior Therapy Facilty/Provider(s): n./a Reason for Treatment: n/a Prior Outpatient Therapy: Prior Outpatient Therapy: No Prior Therapy Dates: n/a Prior Therapy Facilty/Provider(s): n/a Reason for Treatment: n/a Does  patient have an ACCT team?: No Does patient have Intensive In-House Services?  : No Does patient have [facility] services?  : No Does patient have P4CC services?: No  Past Medical History:  Past Medical History  Diagnosis Date  . Alcohol abuse   . Seizures [facility])     Past Surgical History  Procedure Laterality Date  . Back surgery      steel rod placed   Family History: History reviewed. No pertinent family history. Family Psychiatric  History: Denies there being any family history of mental health or substance abuse problems Social History:  History  Alcohol Use No    Comment: sober x 6 months (july 2016)     History  Drug Use No    Social History   Social History  . Marital Status: Single    Spouse Name: N/A  . Number of Children: N/A  . Years of Education: N/A   Social History Main Topics  . Smoking status: Never Smoker   . Smokeless tobacco: Never Used  . Alcohol Use: No     Comment: sober x 6 months (july 2016)  . Drug Use: No  . Sexual Activity: Not Asked   Other Topics Concern  . None   Social History Narrative   Additional Social History:    Allergies:  No Known Allergies  Labs:  Results for orders placed or performed during the hospital encounter of 05/25/16 (from the past 48 hour(s))  Comprehensive metabolic panel     Status: Abnormal   Collection Time: 05/25/16  9:15 PM  Result Value Ref Range   Sodium 142 135 - 145 mmol/L   Potassium 2.9 (LL) 3.5 - 5.1 mmol/L    Comment: CRITICAL RESULT CALLED TO, READ BACK BY AND VERIFIED WITH NELLIE MONAR @ 2216 ON 05/25/2016 BY [facility]    Chloride 104 101 - 111 mmol/L   CO2 25 22 - 32 mmol/L   Glucose, Bld 165 (H) 65 - 99 mg/dL   BUN 8 6 - 20 mg/dL   Creatinine, Ser 0.75 0.61 - 1.24 mg/dL   Calcium 8.5 (L) 8.9 - 10.3 mg/dL   Total Protein 7.7 6.5 - 8.1 g/dL   Albumin 4.6 3.5 - 5.0 g/dL   AST 54 (H) 15 - 41 U/L   ALT 59 17 - 63 U/L   Alkaline Phosphatase 64 38 - 126 U/L   Total Bilirubin 0.7 0.3 - 1.2 mg/dL   GFR calc non Af Amer >60 >60 mL/min   GFR calc Af Amer >60 >60 mL/min    Comment: (NOTE) The eGFR has been  calculated using the CKD EPI equation. This calculation has not been validated in all clinical situations. eGFR's persistently <60 mL/min signify possible Chronic Kidney Disease.    Anion gap 13 5 - 15  Ethanol     Status: Abnormal   Collection Time: 05/25/16  9:15 PM  Result Value Ref Range   Alcohol, Ethyl (B) 434 (HH) <5 mg/dL    Comment: CRITICAL RESULT CALLED TO, READ BACK BY AND VERIFIED WITH NELLIE MONAR @ 2203 ON 05/25/2016 BY [facility]        LOWEST DETECTABLE LIMIT FOR SERUM ALCOHOL IS 5 mg/dL FOR MEDICAL PURPOSES ONLY   Salicylate level     Status: None   Collection Time: 05/25/16  9:15 PM  Result Value Ref Range   Salicylate Lvl <4.6 2.8 - 30.0 mg/dL  Acetaminophen level     Status: Abnormal   Collection Time: 05/25/16  9:15  PM  Result Value Ref Range   Acetaminophen (Tylenol), Serum <10 (L) 10 - 30 ug/mL    Comment:        THERAPEUTIC CONCENTRATIONS VARY SIGNIFICANTLY. A RANGE OF 10-30 ug/mL MAY BE AN EFFECTIVE CONCENTRATION FOR MANY PATIENTS. HOWEVER, SOME ARE BEST TREATED AT CONCENTRATIONS OUTSIDE THIS RANGE. ACETAMINOPHEN CONCENTRATIONS >150 ug/mL AT 4 HOURS AFTER INGESTION AND >50 ug/mL AT 12 HOURS AFTER INGESTION ARE OFTEN ASSOCIATED WITH TOXIC REACTIONS.   CBC with Differential     Status: None   Collection Time: 05/25/16  9:15 PM  Result Value Ref Range   WBC 5.0 3.8 - 10.6 K/uL   RBC 5.28 4.40 - 5.90 MIL/uL   Hemoglobin 15.7 13.0 - 18.0 g/dL   HCT 46.6 40.0 - 52.0 %   MCV 88.2 80.0 - 100.0 fL   MCH 29.8 26.0 - 34.0 pg   MCHC 33.7 32.0 - 36.0 g/dL   RDW 13.8 11.5 - 14.5 %   Platelets 207 150 - 440 K/uL   Neutrophils Relative % 57 %   Neutro Abs 2.9 1.4 - 6.5 K/uL   Lymphocytes Relative 34 %   Lymphs Abs 1.7 1.0 - 3.6 K/uL   Monocytes Relative 8 %   Monocytes Absolute 0.4 0.2 - 1.0 K/uL   Eosinophils Relative 0 %   Eosinophils Absolute 0.0 0 - 0.7 K/uL   Basophils Relative 1 %   Basophils Absolute 0.0 0 - 0.1 K/uL  Urinalysis complete, with  microscopic     Status: Abnormal   Collection Time: 05/26/16  5:40 AM  Result Value Ref Range   Color, Urine YELLOW (A) YELLOW   APPearance CLEAR (A) CLEAR   Glucose, UA NEGATIVE NEGATIVE mg/dL   Bilirubin Urine NEGATIVE NEGATIVE   Ketones, ur NEGATIVE NEGATIVE mg/dL   Specific Gravity, Urine 1.014 1.005 - 1.030   Hgb urine dipstick NEGATIVE NEGATIVE   pH 5.0 5.0 - 8.0   Protein, ur NEGATIVE NEGATIVE mg/dL   Nitrite NEGATIVE NEGATIVE   Leukocytes, UA NEGATIVE NEGATIVE   RBC / HPF 0-5 0 - 5 RBC/hpf   WBC, UA 0-5 0 - 5 WBC/hpf   Bacteria, UA NONE SEEN NONE SEEN   Squamous Epithelial / LPF 0-5 (A) NONE SEEN   Mucous PRESENT   Urine Drug Screen, Qualitative     Status: None   Collection Time: 05/26/16  5:40 AM  Result Value Ref Range   Tricyclic, Ur Screen NONE DETECTED NONE DETECTED   Amphetamines, Ur Screen NONE DETECTED NONE DETECTED   MDMA (Ecstasy)Ur Screen NONE DETECTED NONE DETECTED   Cocaine Metabolite,Ur [place] NONE DETECTED NONE DETECTED   Opiate, Ur Screen NONE DETECTED NONE DETECTED   Phencyclidine (PCP) Ur S NONE DETECTED NONE DETECTED   Cannabinoid 50 Ng, Ur [place] NONE DETECTED NONE DETECTED   Barbiturates, Ur Screen NONE DETECTED NONE DETECTED   Benzodiazepine, Ur Scrn NONE DETECTED NONE DETECTED   Methadone Scn, Ur NONE DETECTED NONE DETECTED    Comment: (NOTE) 370  Tricyclics, urine               Cutoff 1000 ng/mL 200  Amphetamines, urine             Cutoff 1000 ng/mL 300  MDMA (Ecstasy), urine           Cutoff 500 ng/mL 400  Cocaine Metabolite, urine       Cutoff 300 ng/mL 500  Opiate, urine  Cutoff 300 ng/mL 600  Phencyclidine (PCP), urine      Cutoff 25 ng/mL 700  Cannabinoid, urine              Cutoff 50 ng/mL 800  Barbiturates, urine             Cutoff 200 ng/mL 900  Benzodiazepine, urine           Cutoff 200 ng/mL 1000 Methadone, urine                Cutoff 300 ng/mL 1100 1200 The urine drug screen provides only a preliminary, unconfirmed 1300  analytical test result and should not be used for non-medical 1400 purposes. Clinical consideration and professional judgment should 1500 be applied to any positive drug screen result due to possible 1600 interfering substances. A more specific alternate chemical method 1700 must be used in order to obtain a confirmed analytical result.  1800 Gas chromato graphy / mass spectrometry (GC/MS) is the preferred 1900 confirmatory method.     No current facility-administered medications for this encounter.   No current outpatient prescriptions on file.    Musculoskeletal: Strength & Muscle Tone: within normal limits Gait & Station: normal Patient leans: N/A  Psychiatric Specialty Exam: Physical Exam  Nursing note and vitals reviewed. Constitutional: He appears well-developed and well-nourished.  HENT:  Head: Normocephalic and atraumatic.  Eyes: Conjunctivae are normal. Pupils are equal, round, and reactive to light.  Neck: Normal range of motion.  Cardiovascular: Normal rate and normal heart sounds.   Respiratory: Effort normal. No respiratory distress.  GI: Soft.  Musculoskeletal: Normal range of motion.  Neurological: He is alert.  Skin: Skin is warm and dry.  Psychiatric: Judgment and thought content normal. His speech is delayed. He is slowed. He exhibits a depressed mood. He exhibits abnormal recent memory.    Review of Systems  Constitutional: Negative.   HENT: Negative.   Eyes: Negative.   Respiratory: Negative.   Cardiovascular: Negative.   Gastrointestinal: Negative.   Musculoskeletal: Negative.   Skin: Negative.   Neurological: Negative.   Psychiatric/Behavioral: Positive for depression, memory loss and substance abuse. Negative for suicidal ideas and hallucinations. The patient is nervous/anxious and has insomnia.     Blood pressure 120/88, pulse 82, temperature 97.9 F (36.6 C), temperature source Oral, resp. rate 14, height 5' 10"  (1.778 m), weight 72.576 kg  (160 lb), SpO2 95 %.Body mass index is 22.96 kg/(m^2).  General Appearance: Casual  Eye Contact:  Minimal  Speech:  Slow  Volume:  Decreased  Mood:  Anxious  Affect:  Constricted  Thought Process:  Goal Directed  Orientation:  Full (Time, Place, and Person)  Thought Content:  Logical  Suicidal Thoughts:  No  Homicidal Thoughts:  No  Memory:  Immediate;   Good Recent;   Fair Remote;   Fair  Judgement:  Fair  Insight:  Lacking  Psychomotor Activity:  Decreased  Concentration:  Concentration: Fair  Recall:  [employer] of Knowledge:  Fair  Language:  Fair  Akathisia:  No  Handed:  Right  AIMS (if indicated):     Assets:  Communication Skills Desire for Improvement Housing Physical Health Resilience  ADL's:  Intact  Cognition:  WNL  Sleep:        Treatment Plan Summary: Plan 35 year old man presented very intoxicated last night with some concern about suicidality but nothing that I can see that would indicate an actual suicide attempt or that he was making direct threats. Patient  denies any suicidal thinking today. He is lucid and calm. Not having any hallucinations. Vital signs stable. No sign of tremor. Patient does not need inpatient detox level treatment and no longer meets commitment criteria. Discontinue IVC. Case reviewed with emergency room doctor. Patient strongly encouraged to consider getting into treatment with an outpatient therapist and will be referred either to the [facility] system for substance abuse treatment or to [facility] for follow-up mental health treatment. No prescriptions required  Disposition: Patient does not meet criteria for psychiatric inpatient admission. Supportive therapy provided about ongoing stressors.  Alethia Berthold, MD 05/26/2016 1:08 PM

## 2016-05-26 NOTE — ED Provider Notes (Signed)
  Physical Exam  BP 120/88 mmHg  Pulse 82  Temp(Src) 97.9 F (36.6 C) (Oral)  Resp 14  Ht 5\' 10"  (1.778 m)  Wt 160 lb (72.576 kg)  BMI 22.96 kg/m2  SpO2 95%  Physical Exam  ED Course  Procedures  MDM Dr. Toni Amendlapacs saw patient, rescinded IVC. Was intoxicated on arrival but sober as per Dr. Toni Amendlapacs. Vitals stable. Will dc home with outpatient resources.     Richardean Canalavid H Arnella Pralle, MD 05/26/16 1324

## 2016-06-07 ENCOUNTER — Encounter: Payer: Self-pay | Admitting: Emergency Medicine

## 2016-06-07 ENCOUNTER — Emergency Department
Admission: EM | Admit: 2016-06-07 | Discharge: 2016-06-08 | Disposition: A | Discharge status: Home / Self Care | Payer: Self-pay | Attending: Emergency Medicine | Admitting: Emergency Medicine

## 2016-06-07 DIAGNOSIS — Z8669 Personal history of other diseases of the nervous system and sense organs: Secondary | ICD-10-CM | POA: Insufficient documentation

## 2016-06-07 DIAGNOSIS — R259 Unspecified abnormal involuntary movements: Secondary | ICD-10-CM | POA: Insufficient documentation

## 2016-06-07 DIAGNOSIS — Z046 Encounter for general psychiatric examination, requested by authority: Secondary | ICD-10-CM

## 2016-06-07 DIAGNOSIS — F1010 Alcohol abuse, uncomplicated: Secondary | ICD-10-CM | POA: Insufficient documentation

## 2016-06-07 LAB — CBC WITH DIFFERENTIAL/PLATELET
BASOS ABS: 0 10*3/uL (ref 0–0.1)
Basophils Relative: 1 %
EOS PCT: 1 %
Eosinophils Absolute: 0 10*3/uL (ref 0–0.7)
HCT: 42.5 % (ref 40.0–52.0)
HEMOGLOBIN: 14.6 g/dL (ref 13.0–18.0)
LYMPHS ABS: 1.6 10*3/uL (ref 1.0–3.6)
LYMPHS PCT: 38 %
MCH: 30.8 pg (ref 26.0–34.0)
MCHC: 34.3 g/dL (ref 32.0–36.0)
MCV: 89.8 fL (ref 80.0–100.0)
Monocytes Absolute: 0.4 10*3/uL (ref 0.2–1.0)
Monocytes Relative: 8 %
NEUTROS ABS: 2.3 10*3/uL (ref 1.4–6.5)
NEUTROS PCT: 52 %
PLATELETS: 228 10*3/uL (ref 150–440)
RBC: 4.73 MIL/uL (ref 4.40–5.90)
RDW: 14.1 % (ref 11.5–14.5)
WBC: 4.3 10*3/uL (ref 3.8–10.6)

## 2016-06-07 LAB — BLOOD GAS, VENOUS
ACID-BASE EXCESS: 4.8 mmol/L — AB (ref 0.0–3.0)
Bicarbonate: 31 mEq/L — ABNORMAL HIGH (ref 21.0–28.0)
FIO2: 0.21
O2 SAT: 88.2 %
PATIENT TEMPERATURE: 37
PCO2 VEN: 50 mmHg (ref 44.0–60.0)
PO2 VEN: 55 mmHg — AB (ref 31.0–45.0)
pH, Ven: 7.4 (ref 7.320–7.430)

## 2016-06-07 LAB — SALICYLATE LEVEL: Salicylate Lvl: <4 mg/dL (ref 2.8–30.0)

## 2016-06-07 LAB — COMPREHENSIVE METABOLIC PANEL
ALT: 20 U/L (ref 17–63)
ANION GAP: 11 (ref 5–15)
AST: 29 U/L (ref 15–41)
Albumin: 4.5 g/dL (ref 3.5–5.0)
Alkaline Phosphatase: 67 U/L (ref 38–126)
BUN: 12 mg/dL (ref 6–20)
CHLORIDE: 104 mmol/L (ref 101–111)
CO2: 28 mmol/L (ref 22–32)
Calcium: 8.4 mg/dL — ABNORMAL LOW (ref 8.9–10.3)
Creatinine, Ser: 0.73 mg/dL (ref 0.61–1.24)
Glucose, Bld: 93 mg/dL (ref 65–99)
POTASSIUM: 3.9 mmol/L (ref 3.5–5.1)
SODIUM: 143 mmol/L (ref 135–145)
Total Bilirubin: 0.7 mg/dL (ref 0.3–1.2)
Total Protein: 7.4 g/dL (ref 6.5–8.1)

## 2016-06-07 LAB — ETHANOL: ALCOHOL ETHYL (B): 364 mg/dL — AB (ref <5)

## 2016-06-07 LAB — ACETAMINOPHEN LEVEL

## 2016-06-07 MED ORDER — LORAZEPAM 2 MG/ML IJ SOLN: 1.0000 mg | Freq: Four times a day (QID) | INTRAMUSCULAR | Status: DC | PRN

## 2016-06-07 MED ORDER — FOLIC ACID 1 MG PO TABS
1.0000 mg | ORAL_TABLET | Freq: Every day | ORAL | Status: DC
  Administered 2016-06-07: 1 mg via ORAL
  Filled 2016-06-07: qty 1

## 2016-06-07 MED ORDER — ADULT MULTIVITAMIN W/MINERALS CH
1.0000 | ORAL_TABLET | Freq: Every day | ORAL | Status: DC
  Administered 2016-06-07: 1 via ORAL
  Filled 2016-06-07: qty 1

## 2016-06-07 MED ORDER — THIAMINE HCL 100 MG/ML IJ SOLN
100.0000 mg | Freq: Every day | INTRAMUSCULAR | Status: DC
  Filled 2016-06-07: qty 2

## 2016-06-07 MED ORDER — LORAZEPAM 1 MG PO TABS: 1.0000 mg | ORAL_TABLET | Freq: Four times a day (QID) | ORAL | Status: DC | PRN

## 2016-06-07 MED ORDER — ONDANSETRON HCL 4 MG/2ML IJ SOLN
4.0000 mg | Freq: Once | INTRAMUSCULAR | Status: AC
Start: 2016-06-07 — End: 2016-06-07
  Administered 2016-06-07: 4 mg via INTRAVENOUS
  Filled 2016-06-07: qty 2

## 2016-06-07 MED ORDER — VITAMIN B-1 100 MG PO TABS
100.0000 mg | ORAL_TABLET | Freq: Every day | ORAL | Status: DC
  Administered 2016-06-07: 100 mg via ORAL
  Filled 2016-06-07: qty 1

## 2016-06-07 MED ORDER — SODIUM CHLORIDE 0.9 % IV BOLUS (SEPSIS)
1000.0000 mL | Freq: Once | INTRAVENOUS | Status: AC
  Administered 2016-06-07: 1000 mL via INTRAVENOUS

## 2016-06-07 NOTE — BH Assessment (Signed)
Assessment Note  Johnny MastKenneth R Burgess is an 35 y.o. male. He arrived to the ED by being transported by his mother.  He states that his mother is worried about him. He states that he is not worried. He denied symptoms of depression or anxiety.  He denied having auditory or visual hallucinations.  He denied suicidal ideation or intent.  He denied homicidal ideation or intent.  He states that his mother worried about him due to him drinking a lot.  He states that he drank about a half a gallon of vodka today.  He denied drinking anything else. He denied the use of drugs.  He denied drinking Listerine, and states "My mom told them that".  IVC paperwork reports "Reportedly suicidal per family, attempted to overdose on Listerine". Current BAL = 364  Diagnosis: Depression, Alcohol abuse  Past Medical History:  Past Medical History  Diagnosis Date  . Alcohol abuse   . Seizures [facility](HCC)     Past Surgical History  Procedure Laterality Date  . Back surgery      steel rod placed    Family History: History reviewed. No pertinent family history.  Social History:  reports that he has never smoked. He has never used smokeless tobacco. He reports that he does not drink alcohol or use illicit drugs.  Additional Social History:  Alcohol / Drug Use History of alcohol / drug use?: Yes Substance #1 Name of Substance 1: Alcohol 1 - Age of First Use: 17 1 - Amount (size/oz): a lot 1 - Frequency: Daily 1 - Last Use / Amount: 06/07/2016  CIWA: CIWA-Ar BP: 120/84 mmHg Pulse Rate: 91 Nausea and Vomiting: no nausea and no vomiting Tactile Disturbances: none Tremor: no tremor Auditory Disturbances: not present Paroxysmal Sweats: no sweat visible Visual Disturbances: not present Anxiety: no anxiety, at ease Headache, Fullness in Head: none present Agitation: normal activity Orientation and Clouding of Sensorium: oriented and can do serial additions CIWA-Ar Total: 0 COWS:    Allergies: No Known  Allergies  Home Medications:  (Not in a hospital admission)  OB/GYN Status:  No LMP for male patient.  General Assessment Data [facility] Assessment: In system Is this a Tele or Face-to-Face Assessment?: Face-to-Face Is this an Initial Assessment or a Re-assessment for this encounter?: Initial Assessment Marital status: Single Maiden name: n/a Is patient pregnant?: No Pregnancy Status: No Living Arrangements: Parent Can pt return to current living arrangement?: Yes Admission Status: Involuntary Is patient capable of signing voluntary admission?: Yes Referral Source: Self/Family/Friend Insurance type: Self Pay  Medical Screening Exam [facility] Walk-[facility] ONLY) Medical Exam completed: Yes  Crisis Care Plan Living Arrangements: Parent Legal Guardian: Other: (Self) Name of Psychiatrist: Denied Name of Therapist: Denied  Education Status Is patient currently in school?: No Current Grade: n/a Highest grade of school patient has completed: 2 years of college Name of school: [employer] Contact person: n/a  Risk to self with the past 6 months Suicidal Ideation: No Has patient been a risk to self within the past 6 months prior to admission? : No Suicidal Intent: No Has patient had any suicidal intent within the past 6 months prior to admission? : No Is patient at risk for suicide?: No Suicidal Plan?: No Has patient had any suicidal plan within the past 6 months prior to admission? : No Access to Means: No What has been your use of drugs/alcohol within the last 12 months?: daily use of alcohol Previous Attempts/Gestures: No How many times?:  0 Other Self Harm Risks: denied Triggers for Past Attempts: None known Intentional Self Injurious Behavior: None Family Suicide History: No Recent stressful life event(s):  (None reported) Persecutory voices/beliefs?: No Depression: No Depression Symptoms:  (None) Substance abuse history and/or treatment for substance  abuse?: Yes Suicide prevention information given to non-admitted patients: Not applicable  Risk to Others within the past 6 months Homicidal Ideation: No Does patient have any lifetime risk of violence toward others beyond the six months prior to admission? : No Thoughts of Harm to Others: No Current Homicidal Intent: No Current Homicidal Plan: No Access to Homicidal Means: No Identified Victim: None identified History of harm to others?: No Assessment of Violence: None Noted Violent Behavior Description: Denied Does patient have access to weapons?: No Criminal Charges Pending?: No Does patient have a court date: No Is patient on probation?: No  Psychosis Hallucinations: None noted Delusions: None noted  Mental Status Report Appearance/Hygiene: In scrubs Eye Contact: Poor Motor Activity: Unremarkable Speech: Logical/coherent Level of Consciousness: Alert Mood: Irritable Affect: Appropriate to circumstance Anxiety Level: None Thought Processes: Coherent Judgement: Unimpaired Orientation: Place, Person, Time, Situation Obsessive Compulsive Thoughts/Behaviors: None  Cognitive Functioning Concentration: Normal Memory: Recent Intact IQ: Average Insight: Poor Impulse Control: Fair Appetite: Good Sleep: No Change Vegetative Symptoms: None  ADLScreening [facility] Assessment Services) Patient's cognitive ability adequate to safely complete daily activities?: Yes Patient able to express need for assistance with ADLs?: Yes Independently performs ADLs?: Yes (appropriate for developmental age)  Prior Inpatient Therapy Prior Inpatient Therapy: No Prior Therapy Dates: n/a Prior Therapy Facilty/Provider(s): n./a Reason for Treatment: n/a  Prior Outpatient Therapy Prior Outpatient Therapy: No Prior Therapy Dates: n/a Prior Therapy Facilty/Provider(s): n/a Reason for Treatment: n/a Does patient have an ACCT team?: No Does patient have Intensive In-House Services?  : No Does  patient have [facility] services? : No Does patient have P4CC services?: No  ADL Screening (condition at time of admission) Patient's cognitive ability adequate to safely complete daily activities?: Yes Patient able to express need for assistance with ADLs?: Yes Independently performs ADLs?: Yes (appropriate for developmental age)       Abuse/Neglect Assessment (Assessment to be complete while patient is alone) Physical Abuse: Denies Verbal Abuse: Denies Sexual Abuse: Denies Exploitation of patient/patient's resources: Denies Self-Neglect: Denies Values / Beliefs Cultural Requests During Hospitalization: None        Additional Information 1:1 In Past 12 Months?: Yes CIRT Risk: No Elopement Risk: No Does patient have medical clearance?: No     Disposition:  Disposition Initial Assessment Completed for this Encounter: Yes Disposition of Patient: Other dispositions  On Site Evaluation by:   Reviewed with Physician:    Justice Deeds 06/07/2016 10:02 PM

## 2016-06-07 NOTE — ED Notes (Addendum)
Alona BeneJoyce from poison control called to check on pt at this time, RN gave update

## 2016-06-07 NOTE — ED Notes (Signed)
Drank 40 oz of listerine through out the day. States he was drinking it to get high. His mother is insisting he drank it to hurt himself. Pt denies s/i and H/i

## 2016-06-07 NOTE — ED Notes (Signed)
Pt states his mother brought him here because he has been drinking. Pt states she drinks every day and he has been drinking vodka today. Denies any SI or HI. Pt smells heavily of ETOH. Pt denies any pain.

## 2016-06-07 NOTE — ED Provider Notes (Signed)
[facility] Emergency Department Provider Note  ____________________________________________  Time seen: Approximately 8:12 PM  I have reviewed the triage vital signs and the nursing notes.   HISTORY  Chief Complaint Ingestion    HPI Johnny Burgess is a 35 y.o. male previous history of alcohol abuse, substance-induced mood disorder.  Patient presents today, brought by family. He is evidently been drinking heavily. He states he's been drinking vodka all day, however history given at triage reports potentially ingesting Listerine and potentially having an attempt to harm himself by doing so. The patient does however deny this. He does report that he drinks heavily every day. He's been drinking all day today, and denies any coingestions or drinking "Listerine".  D/W patient's father Rocky Link(Ken), reports patient is homeless except for when staying with his mother. He has a very long history of severe alcoholism. He is not aware of any overdose or drug use in the past. Patient [facility] for alcoholism.  Past Medical History  Diagnosis Date  . Alcohol abuse   . Seizures [facility](HCC)     Patient Active Problem List   Diagnosis Date Noted  . Substance induced mood disorder (HCC) 05/26/2016  . Involuntary commitment 05/26/2016  . Alcohol abuse 03/12/2016  . Anxiety disorder 03/12/2016    Past Surgical History  Procedure Laterality Date  . Back surgery      steel rod placed    No current outpatient prescriptions on file.  Allergies Review of patient's allergies indicates no known allergies.  History reviewed. No pertinent family history.  Social History Social History  Substance Use Topics  . Smoking status: Never Smoker   . Smokeless tobacco: Never Used  . Alcohol Use: No     Comment: sober x 6 months (july 2016)    Review of Systems Constitutional: No fever/chills. Eyes: No visual changes. ENT: No  sore throat. Cardiovascular: Denies chest pain. Respiratory: Denies shortness of breath. Gastrointestinal: No abdominal pain.  No nausea, no vomiting.  No diarrhea.  No constipation. Genitourinary: Negative for dysuria. Musculoskeletal: Negative for back pain. Skin: Negative for rash. Neurological: Negative for headaches, focal weakness or numbness.  Patient denies SI/HI, but triage reports his mother reported he is potentially suicidal.  10-point ROS otherwise negative.  ____________________________________________   PHYSICAL EXAM:  VITAL SIGNS: ED Triage Vitals  Enc Vitals Group     BP 06/07/16 1927 121/80 mmHg     Pulse Rate 06/07/16 1927 95     Resp 06/07/16 1927 18     Temp 06/07/16 1927 98 F (36.7 C)     Temp src --      SpO2 06/07/16 1927 96 %     Weight 06/07/16 1927 160 lb (72.576 kg)     Height 06/07/16 1927 5\' 8"  (1.727 m)     Head Cir --      Peak Flow --      Pain Score 06/07/16 1928 0     Pain Loc --      Pain Edu? --      Excl. in GC? --    Constitutional: Alert and oriented. Slurred speech, alert, seated up and in no distress. Eyes: Conjunctivae are injected. PERRL. EOMI. Head: Atraumatic. Nose: No congestion/rhinnorhea. Mouth/Throat: Mucous membranes are slightly dry.  Oropharynx non-erythematous. Neck: No stridor.   Cardiovascular: Normal rate, regular rhythm. Grossly normal heart sounds.  Good peripheral circulation. Respiratory: Normal respiratory effort.  No retractions. Lungs CTAB.  Gastrointestinal: Soft and nontender. No distention. Musculoskeletal: No lower extremity tenderness nor edema.  No joint effusions. Neurologic:  Slight slurring of speech and language. No gross focal neurologic deficits are appreciated. Mild upper ext. Ataxia bilter. Normal cranial nerves. Skin:  Skin is warm, dry and intact. No rash noted. Psychiatric: Mood and affect are calm. Speech and behavior are normal.  ____________________________________________    LABS (all labs ordered are listed, but only abnormal results are displayed)  Labs Reviewed  COMPREHENSIVE METABOLIC PANEL - Abnormal; Notable for the following:    Calcium 8.4 (*)    All other components within normal limits  ETHANOL - Abnormal; Notable for the following:    Alcohol, Ethyl (B) 364 (*)    All other components within normal limits  ACETAMINOPHEN LEVEL - Abnormal; Notable for the following:    Acetaminophen (Tylenol), Serum <10 (*)    All other components within normal limits  BLOOD GAS, VENOUS - Abnormal; Notable for the following:    pO2, Ven 55.0 (*)    Bicarbonate 31.0 (*)    Acid-Base Excess 4.8 (*)    All other components within normal limits  CBC WITH DIFFERENTIAL/PLATELET  SALICYLATE LEVEL  URINE DRUG SCREEN, QUALITATIVE ([facility] ONLY)   ____________________________________________  EKG  ED ECG REPORT I, QUALE, MARK, the attending physician, personally viewed and interpreted this ECG.  Date: 06/07/2016 EKG Time: 1638 Rate: 90 Rhythm: normal sinus rhythm QRS Axis: normal Intervals: normal ST/T Wave abnormalities: normal Conduction Disturbances: none Narrative Interpretation: unremarkable  ____________________________________________  RADIOLOGY   ____________________________________________   PROCEDURES  Procedure(s) performed: None  Critical Care performed: No  ____________________________________________   INITIAL IMPRESSION / ASSESSMENT AND PLAN / ED COURSE  Pertinent labs & imaging results that were available during my care of the patient were reviewed by me and considered in my medical decision making (see chart for details).  Patient presents for evaluation of intoxication and possible suicidal ideation and the patient denies this. He is notably intoxicated, with some slurred speech and ataxia. Labs do not demonstrate any evidence of toxic alcohol.  We will continue the patient on withdrawal protocol, I have placed him under  involuntary commitment based on reported attempted Listerine overdose and possible suicidality as reported by his mother. The patient gave me permission, and I spoke to his father but was unable to reach the patient's mother as the numbers listed do not have an answer under his primary demographics. The patient is felt medically clear to continue on withdrawal protocol and await psychiatry evaluation at this time. ____________________________________________   FINAL CLINICAL IMPRESSION(S) / ED DIAGNOSES  Final diagnoses:  Alcohol abuse  Involuntary commitment      Sharyn Creamer, MD 06/07/16 [phone]

## 2016-06-08 MED ORDER — LORAZEPAM 1 MG PO TABS
1.0000 mg | ORAL_TABLET | Freq: Once | ORAL | Status: AC
  Administered 2016-06-08: 1 mg via ORAL
  Filled 2016-06-08: qty 1

## 2016-06-08 NOTE — ED Notes (Signed)
Mother called to check on patient; was told to call back at 9am to talk to patient. Arvin CollardSheila Meade [phone].

## 2016-06-08 NOTE — ED Provider Notes (Signed)
-----------------------------------------   10:41 AM on 06/08/2016 -----------------------------------------   Blood pressure 109/76, pulse 82, temperature 98.1 F (36.7 C), temperature source Oral, resp. rate 16, height 5\' 8"  (1.727 m), weight 160 lb (72.576 kg), SpO2 97 %.  The patient had no acute events since last update.  Calm and cooperative at this time.  Disposition is pending per Psychiatry/Behavioral Medicine team recommendations.   Patient has been seen by tele-sciatic and has reneged the involuntary commitment. They advise discharged after receiving a dose of benzodiazepine therapy. Patient will be referred for alcohol treatment programs on an outpatient basis.  Jennye MoccasinBrian S Quigley, MD 06/08/16 [phone]

## 2016-06-08 NOTE — Discharge Instructions (Signed)
°  Substance Abuse Testing °WHY AM I HAVING THIS TEST? °Substance abuse testing is done to identify the presence of drugs in the body. It may also be done to: °· Help guide treatment if you have seizures. °· Measure the levels of certain medicines in your body, including anabolic steroids, stimulants, diuretics, and lithium. °Substance abuse testing is most often used by employers or law enforcement agencies to identify whether a person has used illegal drugs, such as cocaine, amphetamines, and marijuana. °WHAT KIND OF SAMPLE IS TAKEN? °Your health care provider may collect at least one of the following to perform the test: °· Urine. A urine sample is collected in a sterile container given to you by the lab. If you are asked to provide a urine sample, do not alter it in any way. °· Hair. A sample of hair requires cutting 50 strands of hair from your scalp. °· Blood. A blood sample is usually collected by inserting a needle into a vein. °HOW DO I PREPARE FOR THE TEST? °You may be asked to provide a list of your current prescription medicines. If the test is required for employment or legal reasons, you will be asked to give consent for the test. There is no other preparation required. °HOW ARE THE TEST RESULTS REPORTED? °Your test results will be reported as either positive or negative. It may be your responsibility to obtain your test results. Ask the lab or department performing the test when and how you will get your results. °WHAT DO THE RESULTS MEAN? °A negative result means no drugs were found. °A positive result may mean you have recently used drugs. If your result is positive, more testing will be done to confirm the presence of drugs. °  °This information is not intended to replace advice given to you by your health care provider. Make sure you discuss any questions you have with your health care provider. °  °Document Released: 12/14/2005 Document Revised: 01/04/2015 Document Reviewed: 05/11/2014 °Elsevier  Interactive Patient Education ©2016 [employer] Inc. ° ° °

## 2016-06-08 NOTE — ED Provider Notes (Signed)
-----------------------------------------   7:01 AM on 06/08/2016 -----------------------------------------   Blood pressure 109/76, pulse 82, temperature 98.1 F (36.7 C), temperature source Oral, resp. rate 16, height 5\' 8"  (1.727 m), weight 160 lb (72.576 kg), SpO2 97 %.  The patient had no acute events since last update.  Calm and cooperative at this time.    Patient to be seen by psych     Rebecka ApleyAllison P Pammie Chirino, MD 06/08/16 [phone]

## 2016-06-15 ENCOUNTER — Encounter: Payer: Self-pay | Admitting: Emergency Medicine

## 2016-06-15 ENCOUNTER — Emergency Department
Admission: EM | Admit: 2016-06-15 | Discharge: 2016-06-15 | Disposition: A | Discharge status: Home / Self Care | Payer: Self-pay | Attending: Emergency Medicine | Admitting: Emergency Medicine

## 2016-06-15 ENCOUNTER — Emergency Department
Admission: EM | Admit: 2016-06-15 | Discharge: 2016-06-16 | Disposition: A | Discharge status: Home / Self Care | Payer: Self-pay | Attending: Emergency Medicine | Admitting: Emergency Medicine

## 2016-06-15 DIAGNOSIS — Z8669 Personal history of other diseases of the nervous system and sense organs: Secondary | ICD-10-CM | POA: Insufficient documentation

## 2016-06-15 DIAGNOSIS — Z5321 Procedure and treatment not carried out due to patient leaving prior to being seen by health care provider: Secondary | ICD-10-CM | POA: Insufficient documentation

## 2016-06-15 DIAGNOSIS — F1010 Alcohol abuse, uncomplicated: Secondary | ICD-10-CM | POA: Insufficient documentation

## 2016-06-15 DIAGNOSIS — F10229 Alcohol dependence with intoxication, unspecified: Secondary | ICD-10-CM | POA: Insufficient documentation

## 2016-06-15 LAB — URINE DRUG SCREEN, QUALITATIVE (ARMC ONLY)
Amphetamines, Ur Screen: NOT DETECTED
Barbiturates, Ur Screen: NOT DETECTED
Benzodiazepine, Ur Scrn: NOT DETECTED
COCAINE METABOLITE, UR ~~LOC~~: NOT DETECTED
Cannabinoid 50 Ng, Ur ~~LOC~~: NOT DETECTED
MDMA (ECSTASY) UR SCREEN: NOT DETECTED
METHADONE SCREEN, URINE: NOT DETECTED
OPIATE, UR SCREEN: NOT DETECTED
Phencyclidine (PCP) Ur S: NOT DETECTED
TRICYCLIC, UR SCREEN: NOT DETECTED

## 2016-06-15 LAB — COMPREHENSIVE METABOLIC PANEL
ALBUMIN: 4.7 g/dL (ref 3.5–5.0)
ALK PHOS: 59 U/L (ref 38–126)
ALT: 18 U/L (ref 17–63)
ANION GAP: 13 (ref 5–15)
AST: 31 U/L (ref 15–41)
BILIRUBIN TOTAL: 0.7 mg/dL (ref 0.3–1.2)
BUN: 13 mg/dL (ref 6–20)
CALCIUM: 8.2 mg/dL — AB (ref 8.9–10.3)
CO2: 22 mmol/L (ref 22–32)
CREATININE: 0.82 mg/dL (ref 0.61–1.24)
Chloride: 108 mmol/L (ref 101–111)
GFR calc Af Amer: >60 mL/min (ref >60)
GFR calc non Af Amer: >60 mL/min (ref >60)
GLUCOSE: 88 mg/dL (ref 65–99)
Potassium: 3.8 mmol/L (ref 3.5–5.1)
SODIUM: 143 mmol/L (ref 135–145)
TOTAL PROTEIN: 7.7 g/dL (ref 6.5–8.1)

## 2016-06-15 LAB — CBC
HEMATOCRIT: 42.7 % (ref 40.0–52.0)
HEMOGLOBIN: 14.3 g/dL (ref 13.0–18.0)
MCH: 30.4 pg (ref 26.0–34.0)
MCHC: 33.4 g/dL (ref 32.0–36.0)
MCV: 91.2 fL (ref 80.0–100.0)
Platelets: 231 10*3/uL (ref 150–440)
RBC: 4.68 MIL/uL (ref 4.40–5.90)
RDW: 14.4 % (ref 11.5–14.5)
WBC: 5.7 10*3/uL (ref 3.8–10.6)

## 2016-06-15 LAB — ETHANOL: Alcohol, Ethyl (B): 399 mg/dL (ref <5)

## 2016-06-15 NOTE — ED Notes (Signed)
Patient states he and his girlfriend broke up on Saturday and he has been upset since then so he has been drinking for about a day.  STates he has consumed a fifth of vodka today.

## 2016-06-15 NOTE — ED Provider Notes (Signed)
[facility] Emergency Department Provider Note ____________________________________________  Time seen: Approximately 8:04 PM  I have reviewed the triage vital signs and the nursing notes.   HISTORY  Chief Complaint Psychiatric Evaluation and Alcohol Problem    HPI Johnny Burgess is a 35 y.o. male who is brought to the ER by his mother due to concerns of heavy drinking. Patient states he's been drinking heavily for the past 6 months; approximately a fifth of vodka 3-4 times a week. He states it started with relationship problems. He is living with his brother and is working at [employer]. He states he does not drink when he goes to work. He denies any SI or intent to hurt himself. He states he does not really want to get help to stop drinking but is here because of his mother.  He was in our ER waiting room earlier today M left without being seen by a physician; he told nursing staff he had been drinking vodka. EMS reported he had been drinking mouthwash; he has been seen in our ER for this on June 12 and tele-psych was consulted at that time.  He was also   Past Medical History  Diagnosis Date  . Alcohol abuse   . Seizures [facility])     Patient Active Problem List   Diagnosis Date Noted  . Substance induced mood disorder (HCC) 05/26/2016  . Involuntary commitment 05/26/2016  . Alcohol abuse 03/12/2016  . Anxiety disorder 03/12/2016    Past Surgical History  Procedure Laterality Date  . Back surgery      steel rod placed    No current outpatient prescriptions on file.  Allergies Review of patient's allergies indicates no known allergies.  History reviewed. No pertinent family history.  Social History Social History  Substance Use Topics  . Smoking status: Never Smoker   . Smokeless tobacco: Never Used  . Alcohol Use: No     Comment: sober since July 2016    Review of Systems Constitutional: No fever/chills Eyes: No visual changes. ENT:  No sore throat. Cardiovascular: Denies chest pain. Respiratory: Denies shortness of breath. Gastrointestinal: No abdominal pain.  No nausea, no vomiting.  No diarrhea.  No constipation. Genitourinary: Negative for dysuria. Musculoskeletal: Negative for back pain. Skin: Negative for rash. Neurological: Negative for headaches, focal weakness or numbness.  10-point ROS otherwise negative.  ____________________________________________   PHYSICAL EXAM:  VITAL SIGNS: ED Triage Vitals  Enc Vitals Group     BP 06/15/16 1952 133/81 mmHg     Pulse Rate 06/15/16 1952 111     Resp 06/15/16 1952 18     Temp 06/15/16 1952 98 F (36.7 C)     Temp Source 06/15/16 1952 Oral     SpO2 06/15/16 1952 97 %     Weight 06/15/16 1952 155 lb (70.308 kg)     Height 06/15/16 1952  (1.778 m)     Head Cir --      Peak Flow --      Pain Score 06/15/16 1952 8     Pain Loc --      Pain Edu? --      Excl. in GC? --    Constitutional: Alert and oriented. Well appearing and in no acute distress. Eyes: Conjunctivae are normal. PERRL. EOMI. Head: Atraumatic. Nose: No congestion/rhinnorhea. Mouth/Throat: Mucous membranes are moist.  Oropharynx non-erythematous. Neck: No stridor.   Cardiovascular: Normal rate, regular rhythm. Grossly normal heart sounds.  Good peripheral circulation. Respiratory:  Normal respiratory effort.  No retractions. Lungs CTAB. Gastrointestinal: Soft and nontender. No distention. No abdominal bruits. No CVA tenderness. Musculoskeletal: No lower extremity tenderness nor edema.   Neurologic:  Normal speech and language. No gross focal neurologic deficits are appreciated. No gait instability. Skin:  Skin is warm, dry and intact. No rash noted. Psychiatric: Mood and affect are normal. Speech and behavior are normal.  ____________________________________________   LABS (all labs ordered are listed, but only abnormal results are displayed)  Labs Reviewed  COMPREHENSIVE  METABOLIC PANEL - Abnormal; Notable for the following:    Calcium 8.2 (*)    All other components within normal limits  ETHANOL - Abnormal; Notable for the following:    Alcohol, Ethyl (B) 399 (*)    All other components within normal limits  CBC  URINE DRUG SCREEN, QUALITATIVE ([facility] ONLY)   ___ ____________________________________________   INITIAL IMPRESSION / ASSESSMENT AND PLAN / ED COURSE  Pertinent labs & imaging results that were available during my care of the patient were reviewed by me and considered in my medical decision making (see chart for details).  At present, patient states he does not think his alcohol is a problem and he does not want help to stop drinking; he denies SI. He states he wants to go home to take care of things as he has to go to work tomorrow. We'll reassess once he is sober. ____________________________________________   FINAL CLINICAL IMPRESSION(S) / ED DIAGNOSES  Final diagnoses:  Alcohol abuse      New prescriptions started this visit New Prescriptions   No medications on file     Maurilio LovelyNoelle Hilja Kintzel, MD 06/15/16 [phone]

## 2016-06-15 NOTE — ED Notes (Signed)
Pt  [facility] EMS reports the pt has been drinking mouth wash, Denies SI/HI.Johnny Burgess. States he just wants to talk to someone..Johnny Burgess

## 2016-06-15 NOTE — ED Provider Notes (Signed)
-----------------------------------------   2:41 PM on 06/15/2016 -----------------------------------------  Patient left from waiting room. I did not evaluate or see this patient.   Phineas SemenGraydon Lexton Hidalgo, MD 06/15/16 [phone]

## 2016-06-15 NOTE — ED Notes (Signed)
Pt arrived to ED to be checked for alcohol intoxication. Pt arrived to the ED via POV with his mother escorted by [employer]. According to the Pt he has an alcohol problem and today he had an argument with his mother. Pt is AOx4 and appears to be under the influence of alcohol and admits to it with his last drink being this AM. Pt's mother left the ED with the intent to take IVC papers on the Pt.

## 2016-06-15 NOTE — ED Notes (Signed)
Patient states "I drank too much and my family members were concerned about me".  Denies SI/ HI.  Patient states "I am here to feel better and detox".

## 2016-06-16 ENCOUNTER — Encounter: Payer: Self-pay | Admitting: Emergency Medicine

## 2016-06-16 ENCOUNTER — Emergency Department
Admission: EM | Admit: 2016-06-16 | Discharge: 2016-06-16 | Disposition: A | Discharge status: Other Acute Inpatient Hospital | Payer: Self-pay | Attending: Emergency Medicine | Admitting: Emergency Medicine

## 2016-06-16 DIAGNOSIS — Z046 Encounter for general psychiatric examination, requested by authority: Secondary | ICD-10-CM

## 2016-06-16 DIAGNOSIS — E876 Hypokalemia: Secondary | ICD-10-CM | POA: Insufficient documentation

## 2016-06-16 DIAGNOSIS — F1010 Alcohol abuse, uncomplicated: Secondary | ICD-10-CM

## 2016-06-16 DIAGNOSIS — F10120 Alcohol abuse with intoxication, uncomplicated: Secondary | ICD-10-CM | POA: Insufficient documentation

## 2016-06-16 DIAGNOSIS — F10920 Alcohol use, unspecified with intoxication, uncomplicated: Secondary | ICD-10-CM

## 2016-06-16 DIAGNOSIS — F1994 Other psychoactive substance use, unspecified with psychoactive substance-induced mood disorder: Secondary | ICD-10-CM | POA: Diagnosis present

## 2016-06-16 LAB — COMPREHENSIVE METABOLIC PANEL
ALBUMIN: 4.7 g/dL (ref 3.5–5.0)
ALK PHOS: 70 U/L (ref 38–126)
ALT: 20 U/L (ref 17–63)
ANION GAP: 14 (ref 5–15)
AST: 35 U/L (ref 15–41)
BILIRUBIN TOTAL: 0.7 mg/dL (ref 0.3–1.2)
BUN: 9 mg/dL (ref 6–20)
CALCIUM: 9.1 mg/dL (ref 8.9–10.3)
CO2: 23 mmol/L (ref 22–32)
CREATININE: 0.74 mg/dL (ref 0.61–1.24)
Chloride: 103 mmol/L (ref 101–111)
GFR calc Af Amer: >60 mL/min (ref >60)
GFR calc non Af Amer: >60 mL/min (ref >60)
GLUCOSE: 121 mg/dL — AB (ref 65–99)
Potassium: 3.3 mmol/L — ABNORMAL LOW (ref 3.5–5.1)
SODIUM: 140 mmol/L (ref 135–145)
TOTAL PROTEIN: 8.1 g/dL (ref 6.5–8.1)

## 2016-06-16 LAB — CBC
HCT: 42.7 % (ref 40.0–52.0)
Hemoglobin: 14.6 g/dL (ref 13.0–18.0)
MCH: 30.8 pg (ref 26.0–34.0)
MCHC: 34.3 g/dL (ref 32.0–36.0)
MCV: 90 fL (ref 80.0–100.0)
PLATELETS: 237 10*3/uL (ref 150–440)
RBC: 4.74 MIL/uL (ref 4.40–5.90)
RDW: 14.2 % (ref 11.5–14.5)
WBC: 6 10*3/uL (ref 3.8–10.6)

## 2016-06-16 LAB — URINE DRUG SCREEN, QUALITATIVE (ARMC ONLY)
Amphetamines, Ur Screen: NOT DETECTED
BARBITURATES, UR SCREEN: NOT DETECTED
Benzodiazepine, Ur Scrn: NOT DETECTED
CANNABINOID 50 NG, UR ~~LOC~~: NOT DETECTED
Cocaine Metabolite,Ur ~~LOC~~: NOT DETECTED
MDMA (ECSTASY) UR SCREEN: NOT DETECTED
Methadone Scn, Ur: NOT DETECTED
Opiate, Ur Screen: NOT DETECTED
PHENCYCLIDINE (PCP) UR S: NOT DETECTED
TRICYCLIC, UR SCREEN: NOT DETECTED

## 2016-06-16 LAB — ETHANOL: Alcohol, Ethyl (B): 336 mg/dL (ref <5)

## 2016-06-16 MED ORDER — POTASSIUM CHLORIDE CRYS ER 20 MEQ PO TBCR
EXTENDED_RELEASE_TABLET | ORAL | Status: AC
  Filled 2016-06-16: qty 2

## 2016-06-16 MED ORDER — POTASSIUM CHLORIDE CRYS ER 20 MEQ PO TBCR
40.0000 meq | EXTENDED_RELEASE_TABLET | Freq: Once | ORAL | Status: AC
  Administered 2016-06-16: 40 meq via ORAL

## 2016-06-16 NOTE — Discharge Instructions (Signed)

## 2016-06-16 NOTE — ED Provider Notes (Signed)
-----------------------------------------   5:53 AM on 06/16/2016 -----------------------------------------   Blood pressure 110/73, pulse 100, temperature 98 F (36.7 C), temperature source Oral, resp. rate 16, height 5\' 10"  (1.778 m), weight 155 lb (70.308 kg), SpO2 94 %.  The patient had no acute events since last update.  Calm and cooperative at this time.  Disposition is pending per Psychiatry/Behavioral Medicine team recommendations.   Patient had observation here in emergency department. Historically the patient usually wakes up from his alcohol and declines on further establishment of alcohol treatment programs etc. Patient supposedly was to have an IVC taken out by his mother but at the time of dictation this is not occurred. If the patient becomes awake and alert and ambulatory and has no suicidal complaints over likely discharge from here from the emergency department.  Jennye MoccasinBrian S Kobe Ofallon, MD 06/16/16 [phone]

## 2016-06-16 NOTE — ED Provider Notes (Addendum)
[facility] Emergency Department Provider Note  ____________________________________________  Time seen: Approximately 5:55 PM  I have reviewed the triage vital signs and the nursing notes.   HISTORY  Chief Complaint Alcohol Intoxication and Psychiatric Evaluation    HPI Johnny Burgess is a 35 y.o. male with a history of alcohol dependence, substance induced mood disorder brought by police for alcohol intoxication. The patient was seen here yesterday for the same, and his mother had said that she would be taking out IVC paperwork, but he was discharged when he was medically and psychiatrically cleared and she had not taken out any paperwork. Today, he ingested additional alcohol, "vodka" and his mother did take out IVC paperwork. On arrival to the emergency department, the patient has a stable gait and clear speech. He is compliant with all of our requests. He denies any SI, HI, or hallucinations. He has no medical complaints at this time. He states that he has in seeing Alcoholics Anonymous, but this is not sufficient to help him with his alcoholism and he is requesting further detox.   Past Medical History  Diagnosis Date  . Alcohol abuse   . Seizures [facility](HCC)     Patient Active Problem List   Diagnosis Date Noted  . Substance induced mood disorder (HCC) 05/26/2016  . Involuntary commitment 05/26/2016  . Alcohol abuse 03/12/2016  . Anxiety disorder 03/12/2016    Past Surgical History  Procedure Laterality Date  . Back surgery      steel rod placed    No current outpatient prescriptions on file.  Allergies Review of patient's allergies indicates no known allergies.  No family history on file.  Social History Social History  Substance Use Topics  . Smoking status: Never Smoker   . Smokeless tobacco: Never Used  . Alcohol Use: No     Comment: sober since July 2016    Review of Systems Constitutional: No fever/chills.No lightheadedness or  syncope. Eyes: No visual changes. ENT: No sore throat. No congestion or rhinorrhea. Cardiovascular: Denies chest pain. Denies palpitations. Respiratory: Denies shortness of breath.  No cough. Gastrointestinal: No abdominal pain.  No nausea, no vomiting.  No diarrhea.  No constipation. Genitourinary: Negative for dysuria. Musculoskeletal: Negative for back pain. Skin: Negative for rash. Neurological: Negative for headaches. No focal numbness, tingling or weakness. No seizures. No tremulousness. Psych: Positive alcohol abuse. Negative SI, HI or hallucinations. 10-point ROS otherwise negative.  ____________________________________________   PHYSICAL EXAM:  VITAL SIGNS: ED Triage Vitals  Enc Vitals Group     BP 06/16/16 1600 136/91 mmHg     Pulse Rate 06/16/16 1600 124     Resp 06/16/16 1600 18     Temp 06/16/16 1600 98.5 F (36.9 C)     Temp Source 06/16/16 1600 Oral     SpO2 06/16/16 1600 96 %     Weight 06/16/16 1600 165 lb (74.844 kg)     Height 06/16/16 1600 5\' 10"  (1.778 m)     Head Cir --      Peak Flow --      Pain Score 06/16/16 1600 5     Pain Loc --      Pain Edu? --      Excl. in GC? --     Constitutional: Alert and oriented. Well appearing and in no acute distress. Answers questions appropriately. Eyes: Conjunctivae are normal.  EOMI. No scleral icterus. Head: Atraumatic. Nose: No congestion/rhinnorhea. Mouth/Throat: Mucous membranes are moist.  Neck: No stridor.  Supple.  No meningismus. Full range of motion without pain. Cardiovascular: Normal rate, regular rhythm. No murmurs, rubs or gallops.  Respiratory: Normal respiratory effort.  No accessory muscle use or retractions. Lungs CTAB.  No wheezes, rales or ronchi. Gastrointestinal: Soft, nontender and nondistended.  No guarding or rebound.  No peritoneal signs. Musculoskeletal: No LE edema. No ttp in the calves or palpable cords.  Negative Homan's sign. Neurologic:  A&Ox3.  Speech is clear.  Face and smile  are symmetric.  EOMI.  Moves all extremities well. Skin:  Skin is warm, dry and intact. No rash noted. Psychiatric: Mood and affect are normal. Speech and behavior are normal.  Normal judgement.  Patient has good insight into why he is here. He denies any SI, HI or hallucinations.  ____________________________________________   LABS (all labs ordered are listed, but only abnormal results are displayed)  Labs Reviewed  COMPREHENSIVE METABOLIC PANEL - Abnormal; Notable for the following:    Potassium 3.3 (*)    Glucose, Bld 121 (*)    All other components within normal limits  ETHANOL - Abnormal; Notable for the following:    Alcohol, Ethyl (B) 336 (*)    All other components within normal limits  CBC  URINE DRUG SCREEN, QUALITATIVE ([facility] ONLY)   ____________________________________________  EKG  Not indicated ____________________________________________  RADIOLOGY  No results found.  ____________________________________________   PROCEDURES  Procedure(s) performed: None  Critical Care performed: No ____________________________________________   INITIAL IMPRESSION / ASSESSMENT AND PLAN / ED COURSE  Pertinent labs & imaging results that were available during my care of the patient were reviewed by me and considered in my medical decision making (see chart for details).  34 y.o. m69ale with a history of alcohol dependence prop by police after his mother took out IVC paperwork due to his alcohol overuse. In the emergency department, the patient has stable vital signs and no evidence of withdrawal or delirium tremens in my examination. He does have a blood alcohol level in the 300s. He does not have any red flag symptoms that would warrant involuntary commitment, and this has been rescinded by Dr. Delaney Meigs; I agree with this.  Patient does agree to wait for [place] to help organize transfer to a detox facility.  ----------------------------------------- 10:13 PM on  06/16/2016 -----------------------------------------  The patient has been accepted for transfer at [facility].  ____________________________________________  FINAL CLINICAL IMPRESSION(S) / ED DIAGNOSES  Final diagnoses:  Alcohol intoxication, uncomplicated (HCC)  Hypokalemia      NEW MEDICATIONS STARTED DURING THIS VISIT:  New Prescriptions   No medications on file     Rockne Menghini, MD 06/16/16 1800  Rockne Menghini, MD 06/16/16 2214

## 2016-06-16 NOTE — ED Notes (Signed)
Pt given breakfast tray

## 2016-06-16 NOTE — BH Specialist Note (Addendum)
Patient has been [facility].  Patient assigned to room OBS Bed 5 Accepting physician is Stephani PoliceSpenser Simon for Dr. Lucianne MussKumar.  Call report to [phone].  Representative was [place].  ER Staff is aware of it [facility].; Dr. Doyce ParaNornam, ER MD & Butch Patient's Nurse)

## 2016-06-16 NOTE — ED Notes (Signed)
Pt given warm blanket.

## 2016-06-16 NOTE — ED Notes (Signed)
BEHAVIORAL HEALTH ROUNDING  Patient sleeping: Yes Patient alert and oriented: Sleeping Behavior appropriate: Yes. ; If no, describe:  Nutrition and fluids offered: No, sleeping  Toileting and hygiene offered: No, sleeping  Sitter present: q15 minute observations and security monitoring  Law enforcement present: Yes ODS 

## 2016-06-16 NOTE — ED Notes (Signed)
Pt encouraged to increase H2O intake for hydration, pt verbalizes understanding.

## 2016-06-16 NOTE — BH Assessment (Signed)
Assessment Note  Johnny Burgess is an 35 y.o. male. Mr. Reim arrived to the ED under IVC due to intoxication. Mr. Ayotte was brought in to the ED yesterday 06/15/2016 for intoxication as well.  He reports that he was drinking vodka today.  He states that he drank "a fifth I think". He reports that he was feeling depressed. He states that he is feeling down about his circumstances.  He denied symptoms of anxiety. He denied having auditory or visual hallucinations.  He denied suicidal or homicidal ideation or intent.  He denied the use of drugs.  He denied any additional stressors.   Diagnosis: Alcohol Abuse  Past Medical History:  Past Medical History  Diagnosis Date  . Alcohol abuse   . Seizures [facility])     Past Surgical History  Procedure Laterality Date  . Back surgery      steel rod placed    Family History: No family history on file.  Social History:  reports that he has never smoked. He has never used smokeless tobacco. He reports that he does not drink alcohol or use illicit drugs.  Additional Social History:  Alcohol / Drug Use History of alcohol / drug use?: Yes Substance #1 Name of Substance 1: Alcohol 1 - Age of First Use: 19 1 - Amount (size/oz): fifth of vodka 1 - Frequency: 3-4 times a week 1 - Last Use / Amount: 03/16/2016  CIWA: CIWA-Ar BP: 124/88 mmHg Pulse Rate: 94 COWS:    Allergies: No Known Allergies  Home Medications:  (Not in a hospital admission)  OB/GYN Status:  No LMP for male patient.  General Assessment Data Location of Assessment: [facility] ED TTS Assessment: In system Is this a Tele or Face-to-Face Assessment?: Face-to-Face Is this an Initial Assessment or a Re-assessment for this encounter?: Initial Assessment Marital status: Single Maiden name: n/a Is patient pregnant?: No Pregnancy Status: No Living Arrangements: Parent Can pt return to current living arrangement?: Yes Admission Status: Voluntary Is patient capable of signing voluntary  admission?: Yes Referral Source: Self/Family/Friend Insurance type: Self Pay  Medical Screening Exam [facility] Walk-in ONLY) Medical Exam completed: Yes  Crisis Care Plan Living Arrangements: Parent Legal Guardian: Other: (Self) Name of Psychiatrist: None Name of Therapist: None  Education Status Is patient currently in school?: No Current Grade: n/a Highest grade of school patient has completed: 2 years of college Name of school: [employer] Contact person: n/a  Risk to self with the past 6 months Suicidal Ideation: No Has patient been a risk to self within the past 6 months prior to admission? : No Suicidal Intent: No Has patient had any suicidal intent within the past 6 months prior to admission? : No Is patient at risk for suicide?: No Suicidal Plan?: No Has patient had any suicidal plan within the past 6 months prior to admission? : No Access to Means: No What has been your use of drugs/alcohol within the last 12 months?: Use of alcohol daily Previous Attempts/Gestures: No How many times?: 0 Other Self Harm Risks: denied Triggers for Past Attempts: None known Intentional Self Injurious Behavior: None Family Suicide History: No Recent stressful life event(s): Other (Comment) (living situation, unemployed) Persecutory voices/beliefs?: No Depression: Yes Depression Symptoms: Despondent Substance abuse history and/or treatment for substance abuse?: Yes Suicide prevention information given to non-admitted patients: Not applicable  Risk to Others within the past 6 months Homicidal Ideation: No Does patient have any lifetime risk of violence toward others beyond the six months prior  to admission? : No Thoughts of Harm to Others: No Current Homicidal Intent: No Current Homicidal Plan: No Access to Homicidal Means: No Identified Victim: None identified History of harm to others?: No Assessment of Violence: None Noted Violent Behavior Description: denied Does patient have  access to weapons?: No Criminal Charges Pending?: No Does patient have a court date: No Is patient on probation?: No  Psychosis Hallucinations: None noted Delusions: None noted  Mental Status Report Appearance/Hygiene: In scrubs Eye Contact: Poor Motor Activity: Unremarkable Speech: Logical/coherent Level of Consciousness: Alert Mood: Irritable Affect: Appropriate to circumstance Anxiety Level: None Thought Processes: Coherent Judgement: Unimpaired Orientation: Person, Place, Time, Situation Obsessive Compulsive Thoughts/Behaviors: None  Cognitive Functioning Concentration: Decreased Memory: Recent Intact IQ: Average Insight: Poor Impulse Control: Poor Appetite: Fair Sleep: No Change Vegetative Symptoms: None  ADLScreening [facility] Assessment Services) Patient's cognitive ability adequate to safely complete daily activities?: Yes Patient able to express need for assistance with ADLs?: Yes Independently performs ADLs?: Yes (appropriate for developmental age)  Prior Inpatient Therapy Prior Inpatient Therapy: No Prior Therapy Dates: n/a Prior Therapy Facilty/Provider(s): n/a Reason for Treatment: n/a  Prior Outpatient Therapy Prior Outpatient Therapy: No Prior Therapy Dates: n/a Prior Therapy Facilty/Provider(s): n/a Reason for Treatment: n/a Does patient have an ACCT team?: No Does patient have Intensive In-House Services?  : No Does patient have [facility] services? : No Does patient have P4CC services?: No  ADL Screening (condition at time of admission) Patient's cognitive ability adequate to safely complete daily activities?: Yes Patient able to express need for assistance with ADLs?: Yes Independently performs ADLs?: Yes (appropriate for developmental age)       Abuse/Neglect Assessment (Assessment to be complete while patient is alone) Physical Abuse: Denies Verbal Abuse: Denies Sexual Abuse: Denies Exploitation of patient/patient's resources:  Denies Self-Neglect: Denies Values / Beliefs Cultural Requests During Hospitalization: None   Advance Directives (For Healthcare) Does patient have an advance directive?: No Would patient like information on creating an advanced directive?: No - patient declined information    Additional Information 1:1 In Past 12 Months?: Yes CIRT Risk: No Elopement Risk: No Does patient have medical clearance?: Yes     Disposition:  Disposition Initial Assessment Completed for this Encounter: Yes Disposition of Patient: Other dispositions Other disposition(s): Other (Comment) (Referred to OBS)  On Site Evaluation by:   Reviewed with Physician:    Justice DeedsKeisha Carrine Kroboth 06/16/2016 10:36 PM

## 2016-06-16 NOTE — ED Notes (Signed)
Pt ambulated to bathroom. Pt walking around room.

## 2016-06-16 NOTE — ED Notes (Addendum)
BEHAVIORAL HEALTH ROUNDING  Patient sleeping: Yes Patient alert and oriented: Sleeping Behavior appropriate: Yes. ; If no, describe:  Nutrition and fluids offered: No, sleeping  Toileting and hygiene offered: No, sleeping  Sitter present: q15 minute observations and security monitoring  Law enforcement present: Yes ODS 

## 2016-06-16 NOTE — ED Notes (Signed)
[facility] [place] [facility] [place] [facility]  Patient states, "They think I've been drinking too much."  Patient's speech is slightly slurred.  Patient states he drank 1/5th of vodka last night.  Patient states he would like to stop drinking.  Patient denies SI and HI.  Patient reports going to [employer] meetings.

## 2016-06-16 NOTE — ED Notes (Signed)
Pt given warm blanket , sandwich tray and sprite. Pt informed I need urine.

## 2016-06-16 NOTE — Consult Note (Signed)
[facility] Face-to-Face Psychiatry Consult   Reason for Consult:  35 year old man with a history of alcohol dependence who was just discharged this morning and is brought back on involuntary commitment filed by his mother. Referring Physician:  Mariea Clonts Patient Identification: Johnny Burgess MRN:  [id number] Principal Diagnosis: Alcohol abuse Diagnosis:   Patient Active Problem List   Diagnosis Date Noted  . Substance induced mood disorder ([place]) [F19.94] 05/26/2016  . Involuntary commitment [Z04.6] 05/26/2016  . Alcohol abuse [F10.10] 03/12/2016  . Anxiety disorder [F41.9] 03/12/2016    Total Time spent with patient: 45 minutes  Subjective:   Johnny Burgess is a 35 y.o. male patient admitted with "I've just been feeling nervous".  HPI:  Patient interviewed. Chart reviewed. Labs and vitals reviewed. 35 year old man was just discharged from the emergency room this morning after being brought in intoxicated last night. His mother filed commitment papers today stating that he is acting bizarre and agitated and that he tried to jump out of a car. Patient tells me that he left this morning, went out for lunch, went back to his mother's house and then she called the police. He tends to minimize everything. He is rather evasive in his conversation part of which is probably due to his being intoxicated. Patient denied to me that he had had any alcohol since leaving this morning but this is clearly not true. His current blood alcohol level was 366 which would not be possible if he had not been drinking today. Patient denies that he is using any other drugs. He says that his mood has not been particularly depressed. He denies suicidal or homicidal ideation. Denies any wish to harm himself. As usual he minimizes his drinking problem saying that he only drinks when he feels nervous which she has been feeling recently. Denies any current hallucinations. Denies suicidal thoughts. Patient is calm and cooperative and  polite right now.  Social history: Currently living with his mother. Doesn't seem to be working right now.  Medical history: History of recurrent presentations with alcohol abuse. He denies any history of seizures or delirium tremens and we don't have any documentation to the contrary.  Substance abuse history: Long history of alcohol abuse. Multiple presentations here intoxicated. Patient minimizes it and does not seem to have any willingness to engage in substance abuse treatment. Denies that he is abusing any other drugs.  Past Psychiatric History: Patient denies any history of suicidal behavior. Denies any history of psychosis and denies any history of violence. He denies the allegations in the petition from his mother. Not currently taking any medicine.  Risk to Self: Is patient at risk for suicide?: No Risk to Others:   Prior Inpatient Therapy:   Prior Outpatient Therapy:    Past Medical History:  Past Medical History  Diagnosis Date  . Alcohol abuse   . Seizures [facility])     Past Surgical History  Procedure Laterality Date  . Back surgery      steel rod placed   Family History: No family history on file. Family Psychiatric  History: Patient denies any family history of mental health problems Social History:  History  Alcohol Use No    Comment: sober since July 2016     History  Drug Use No    Social History   Social History  . Marital Status: Single    Spouse Name: N/A  . Number of Children: N/A  . Years of Education: N/A   Social History Main Topics  .  Smoking status: Never Smoker   . Smokeless tobacco: Never Used  . Alcohol Use: No     Comment: sober since July 2016  . Drug Use: No  . Sexual Activity: Not Asked   Other Topics Concern  . None   Social History Narrative   Additional Social History:    Allergies:  No Known Allergies  Labs:  Results for orders placed or performed during the hospital encounter of 06/16/16 (from the past 48 hour(s))   Comprehensive metabolic panel     Status: Abnormal   Collection Time: 06/16/16  4:03 PM  Result Value Ref Range   Sodium 140 135 - 145 mmol/L   Potassium 3.3 (L) 3.5 - 5.1 mmol/L   Chloride 103 101 - 111 mmol/L   CO2 23 22 - 32 mmol/L   Glucose, Bld 121 (H) 65 - 99 mg/dL   BUN 9 6 - 20 mg/dL   Creatinine, Ser 0.74 0.61 - 1.24 mg/dL   Calcium 9.1 8.9 - 10.3 mg/dL   Total Protein 8.1 6.5 - 8.1 g/dL   Albumin 4.7 3.5 - 5.0 g/dL   AST 35 15 - 41 U/L   ALT 20 17 - 63 U/L   Alkaline Phosphatase 70 38 - 126 U/L   Total Bilirubin 0.7 0.3 - 1.2 mg/dL   GFR calc non Af Amer >60 >60 mL/min   GFR calc Af Amer >60 >60 mL/min    Comment: (NOTE) The eGFR has been calculated using the CKD EPI equation. This calculation has not been validated in all clinical situations. eGFR's persistently <60 mL/min signify possible Chronic Kidney Disease.    Anion gap 14 5 - 15  Ethanol     Status: Abnormal   Collection Time: 06/16/16  4:03 PM  Result Value Ref Range   Alcohol, Ethyl (B) 336 (HH) <5 mg/dL    Comment: CRITICAL RESULT CALLED TO, READ BACK BY AND VERIFIED WITH KATIE NEWSHOLME AT 1650 06/16/2016 BY TFK        LOWEST DETECTABLE LIMIT FOR SERUM ALCOHOL IS 5 mg/dL FOR MEDICAL PURPOSES ONLY   cbc     Status: None   Collection Time: 06/16/16  4:03 PM  Result Value Ref Range   WBC 6.0 3.8 - 10.6 K/uL   RBC 4.74 4.40 - 5.90 MIL/uL   Hemoglobin 14.6 13.0 - 18.0 g/dL   HCT 42.7 40.0 - 52.0 %   MCV 90.0 80.0 - 100.0 fL   MCH 30.8 26.0 - 34.0 pg   MCHC 34.3 32.0 - 36.0 g/dL   RDW 14.2 11.5 - 14.5 %   Platelets 237 150 - 440 K/uL  Urine Drug Screen, Qualitative     Status: None   Collection Time: 06/16/16  4:41 PM  Result Value Ref Range   Tricyclic, Ur Screen NONE DETECTED NONE DETECTED   Amphetamines, Ur Screen NONE DETECTED NONE DETECTED   MDMA (Ecstasy)Ur Screen NONE DETECTED NONE DETECTED   Cocaine Metabolite,Ur [place] NONE DETECTED NONE DETECTED   Opiate, Ur Screen NONE DETECTED NONE  DETECTED   Phencyclidine (PCP) Ur S NONE DETECTED NONE DETECTED   Cannabinoid 50 Ng, Ur [place] NONE DETECTED NONE DETECTED   Barbiturates, Ur Screen NONE DETECTED NONE DETECTED   Benzodiazepine, Ur Scrn NONE DETECTED NONE DETECTED   Methadone Scn, Ur NONE DETECTED NONE DETECTED    Comment: (NOTE) 623  Tricyclics, urine               Cutoff 1000 ng/mL 200  Amphetamines, urine  Cutoff 1000 ng/mL 300  MDMA (Ecstasy), urine           Cutoff 500 ng/mL 400  Cocaine Metabolite, urine       Cutoff 300 ng/mL 500  Opiate, urine                   Cutoff 300 ng/mL 600  Phencyclidine (PCP), urine      Cutoff 25 ng/mL 700  Cannabinoid, urine              Cutoff 50 ng/mL 800  Barbiturates, urine             Cutoff 200 ng/mL 900  Benzodiazepine, urine           Cutoff 200 ng/mL 1000 Methadone, urine                Cutoff 300 ng/mL 1100 1200 The urine drug screen provides only a preliminary, unconfirmed 1300 analytical test result and should not be used for non-medical 1400 purposes. Clinical consideration and professional judgment should 1500 be applied to any positive drug screen result due to possible 1600 interfering substances. A more specific alternate chemical method 1700 must be used in order to obtain a confirmed analytical result.  1800 Gas chromato graphy / mass spectrometry (GC/MS) is the preferred 1900 confirmatory method.     Current Facility-Administered Medications  Medication Dose Route Frequency Provider Last Rate Last Dose  . potassium chloride SA (K-DUR,KLOR-CON) CR tablet 40 mEq  40 mEq Oral Once Eula Listen, MD       No current outpatient prescriptions on file.    Musculoskeletal: Strength & Muscle Tone: decreased Gait & Station: ataxic Patient leans: N/A  Psychiatric Specialty Exam: Physical Exam  Nursing note and vitals reviewed. Constitutional: He appears well-developed and well-nourished.  HENT:  Head: Normocephalic and atraumatic.  Eyes:  Conjunctivae are normal. Pupils are equal, round, and reactive to light.  Neck: Normal range of motion.  Cardiovascular: Regular rhythm and normal heart sounds.   Respiratory: Effort normal. No respiratory distress.  GI: Soft.  Musculoskeletal: Normal range of motion.  Neurological: He is alert.  Skin: Skin is warm and dry.  Psychiatric: His affect is blunt. His speech is delayed and slurred. He is slowed. Thought content is not paranoid and not delusional. He expresses impulsivity. He expresses no homicidal and no suicidal ideation. He exhibits abnormal recent memory. He is inattentive.    Review of Systems  Constitutional: Negative.   HENT: Negative.   Eyes: Negative.   Respiratory: Negative.   Cardiovascular: Negative.   Gastrointestinal: Negative.   Musculoskeletal: Negative.   Skin: Negative.   Neurological: Negative.   Psychiatric/Behavioral: Positive for memory loss and substance abuse. Negative for depression, suicidal ideas and hallucinations. The patient is nervous/anxious and has insomnia.     Blood pressure 136/91, pulse 124, temperature 98.5 F (36.9 C), temperature source Oral, resp. rate 18, height 5' 10"  (1.778 m), weight 74.844 kg (165 lb), SpO2 96 %.Body mass index is 23.68 kg/(m^2).  General Appearance: Disheveled  Eye Contact:  Minimal  Speech:  Garbled and Slow  Volume:  Decreased  Mood:  Euthymic  Affect:  Constricted  Thought Process:  Disorganized and Descriptions of Associations: Tangential  Orientation:  Full (Time, Place, and Person)  Thought Content:  Logical  Suicidal Thoughts:  No  Homicidal Thoughts:  No  Memory:  Immediate;   Good Recent;   Fair Remote;   Fair  Judgement:  Impaired  Insight:  Shallow  Psychomotor Activity:  Decreased  Concentration:  Concentration: Fair  Recall:  [employer] of Knowledge:  Fair  Language:  Fair  Akathisia:  No  Handed:  Right  AIMS (if indicated):     Assets:  Communication Skills Housing Physical  Health Resilience  ADL's:  Intact  Cognition:  Impaired,  Mild  Sleep:        Treatment Plan Summary: Plan 35 year old man who is brought back to the hospital once again intoxicated. Despite his intoxication the patient is calm and cooperative and pleasant here. He completely denies any suicidal or homicidal ideation. He is not behaving in an aggressive manner. He really does not meet commitment criteria and does not have evidence of a mental health problem mother than his alcohol abuse. Commitment discontinued. I have asked [facility] to see if they can find either an observation bed or a referral to residential treatment services for this patient. If he would agree to them then we can get him at least detoxed. Otherwise the patient does not meet commitment criteria. He has been advised about the dangers of continued alcohol abuse and strongly encouraged to get back into a substance abuse treatment program. Case reviewed with emergency room physician. Labs reviewed.  Disposition: Patient does not meet criteria for psychiatric inpatient admission. Supportive therapy provided about ongoing stressors.  Alethia Berthold, MD 06/16/2016 5:42 PM

## 2016-06-17 ENCOUNTER — Observation Stay (HOSPITAL_COMMUNITY)
Admission: EM | Admit: 2016-06-17 | Discharge: 2016-06-17 | Disposition: A | Discharge status: Home / Self Care | Payer: Self-pay | Source: Intra-hospital | Attending: Psychiatry | Admitting: Psychiatry

## 2016-06-17 ENCOUNTER — Encounter (HOSPITAL_COMMUNITY): Payer: Self-pay

## 2016-06-17 DIAGNOSIS — F1094 Alcohol use, unspecified with alcohol-induced mood disorder: Secondary | ICD-10-CM

## 2016-06-17 DIAGNOSIS — Y908 Blood alcohol level of 240 mg/100 ml or more: Secondary | ICD-10-CM | POA: Insufficient documentation

## 2016-06-17 DIAGNOSIS — F419 Anxiety disorder, unspecified: Secondary | ICD-10-CM | POA: Insufficient documentation

## 2016-06-17 DIAGNOSIS — F1014 Alcohol abuse with alcohol-induced mood disorder: Principal | ICD-10-CM | POA: Insufficient documentation

## 2016-06-17 MED ORDER — TRAZODONE HCL 50 MG PO TABS
50.0000 mg | ORAL_TABLET | Freq: Every evening | ORAL | Status: DC | PRN
  Administered 2016-06-17: 50 mg via ORAL
  Filled 2016-06-17: qty 1

## 2016-06-17 MED ORDER — ACETAMINOPHEN 325 MG PO TABS: 650.0000 mg | ORAL_TABLET | Freq: Four times a day (QID) | ORAL | Status: DC | PRN

## 2016-06-17 MED ORDER — TRAZODONE HCL 50 MG PO TABS: 50.0000 mg | ORAL_TABLET | Freq: Every evening | ORAL | Status: AC | PRN

## 2016-06-17 MED ORDER — LORAZEPAM 1 MG PO TABS: 1.0000 mg | ORAL_TABLET | Freq: Three times a day (TID) | ORAL | Status: DC

## 2016-06-17 MED ORDER — ALUM & MAG HYDROXIDE-SIMETH 200-200-20 MG/5ML PO SUSP: 30.0000 mL | ORAL | Status: DC | PRN

## 2016-06-17 MED ORDER — ONDANSETRON 4 MG PO TBDP: 4.0000 mg | ORAL_TABLET | Freq: Four times a day (QID) | ORAL | Status: DC | PRN

## 2016-06-17 MED ORDER — LORAZEPAM 1 MG PO TABS: 1.0000 mg | ORAL_TABLET | Freq: Every day | ORAL | Status: DC

## 2016-06-17 MED ORDER — LORAZEPAM 1 MG PO TABS
1.0000 mg | ORAL_TABLET | Freq: Four times a day (QID) | ORAL | Status: DC
  Administered 2016-06-17 (×2): 1 mg via ORAL
  Filled 2016-06-17 (×2): qty 1

## 2016-06-17 MED ORDER — LORAZEPAM 1 MG PO TABS
1.0000 mg | ORAL_TABLET | Freq: Four times a day (QID) | ORAL | Status: DC | PRN
  Filled 2016-06-17: qty 1

## 2016-06-17 MED ORDER — LOPERAMIDE HCL 2 MG PO CAPS: 2.0000 mg | ORAL_CAPSULE | ORAL | Status: DC | PRN

## 2016-06-17 MED ORDER — ADULT MULTIVITAMIN W/MINERALS CH
1.0000 | ORAL_TABLET | Freq: Every day | ORAL | Status: DC
  Administered 2016-06-17: 1 via ORAL
  Filled 2016-06-17: qty 1

## 2016-06-17 MED ORDER — THIAMINE HCL 100 MG/ML IJ SOLN: 100.0000 mg | Freq: Once | INTRAMUSCULAR | Status: DC

## 2016-06-17 MED ORDER — LORAZEPAM 1 MG PO TABS: 1.0000 mg | ORAL_TABLET | Freq: Two times a day (BID) | ORAL | Status: DC

## 2016-06-17 MED ORDER — MAGNESIUM HYDROXIDE 400 MG/5ML PO SUSP: 30.0000 mL | Freq: Every day | ORAL | Status: DC | PRN

## 2016-06-17 MED ORDER — VITAMIN B-1 100 MG PO TABS: 100.0000 mg | ORAL_TABLET | Freq: Every day | ORAL | Status: DC

## 2016-06-17 MED ORDER — HYDROXYZINE HCL 25 MG PO TABS
25.0000 mg | ORAL_TABLET | Freq: Four times a day (QID) | ORAL | Status: DC | PRN
  Administered 2016-06-17: 25 mg via ORAL
  Filled 2016-06-17: qty 1

## 2016-06-17 NOTE — B[facility] Counselor (Signed)
Spoke with pt. Mother[Shila] upon verbal consent from patient for safety assurance and care after d/c. Mother states feels safe for patient to return home. Mother stated can pick up patient after or at 5:30 p.m. This day. Per Eugenie BirksWhithrow, NP pt cleared for d/c upon safety assurance from parent/ relative. Tykisha Areola K. Sherlon HandingHarris, LCAS-A, LPC-A, [facility]  Counselor 06/17/2016 1:11 PM

## 2016-06-17 NOTE — B[facility] Counselor (Signed)
Referral for [facility] and [facility] faxed this a.m. For residential treatment or detox.  Drae Mitzel K. Sherlon HandingHarris, LCAS-A, LPC-A, [facility]  Counselor 06/17/2016 8:04 AM

## 2016-06-17 NOTE — Discharge Instructions (Signed)
You have expressed that  you receive care from [facility] And wish to continue care through [facility] Service via outpatient. [facility] or [facility] To follow up with primary care Dr. For referral and continuation of care services.

## 2016-06-17 NOTE — B[facility] Counselor (Signed)
Spoke with pt. , Pt asked to decline residential treatment referral. Request for outpt. Treatment Johnny Burgess, LCAS-A, LPC-A, [facility]  Counselor 06/17/2016 8:53 AM

## 2016-06-17 NOTE — Progress Notes (Signed)
Johnny KindsKenneth Burgess admitted to Obs bed 5 at 00:16.  Pt alert, awake and denies any pain or discomfort.  Pt sts he has some level of anxiety at this time and difficulty sleeping.  Pt sts [facility], d/t bizarre behavior d/t ETOH abuse.  Pt volunteers that main trigger is a relationship problem with his gf. Pt denies SI, HI or AVH.  Pt verbally contracts for safety.  Pt asks for meal stating [facility].   Pt given salad and soda.  Pt given PRN meds.  Pt continuously observed on unit for safety except when in bathroom. Pt remains safe on unit.

## 2016-06-17 NOTE — H&P (Signed)
[facility] Provider Admission PAA/H&P  Patient Identification: Johnny Burgess MRN:  [id number] Date of Evaluation:  06/17/2016 Chief Complaint:  Alcohol Abuse Principal Diagnosis: <principal problem not specified> Diagnosis:   Patient Active Problem List   Diagnosis Date Noted  . Alcohol-induced mood disorder ([place]) [F10.94] 06/17/2016  . Substance induced mood disorder ([place]) [F19.94] 05/26/2016  . Involuntary commitment [Z04.6] 05/26/2016  . Alcohol abuse [F10.10] 03/12/2016  . Anxiety disorder [F41.9] 03/12/2016   History of Present Illness: Patient is a 35 y/o male accepted from [facility] after repeated ED visit due to alcohol intoxication and related mood disorder. The patient's mother filed IVC paper work due to reported bizarre behaviors, but the patient is no under IVC orders at this time. Patient is without a prior psychiatric diagnosis other than alcohol abuse, denies suicidality, homicidality, AVH, paranoia or delusional thoughts. Earlier assessment completed below, endorses the chronic nature of Johnny Burgess's alcohol abuse/dependence.  Patient interviewed. Chart reviewed. Labs and vitals reviewed. 35 year old man was just discharged from the emergency room this morning after being brought in intoxicated last night. His mother filed commitment papers today stating that he is acting bizarre and agitated and that he tried to jump out of a car. Patient tells me that he left this morning, went out for lunch, went back to his mother's house and then she called the police. He tends to minimize everything. He is rather evasive in his conversation part of which is probably due to his being intoxicated. Patient denied to me that he had had any alcohol since leaving this morning but this is clearly not true. His current blood alcohol level was 366 which would not be possible if he had not been drinking today. Patient denies that he is using any other drugs. He says that his mood has not been  particularly depressed. He denies suicidal or homicidal ideation. Denies any wish to harm himself. As usual he minimizes his drinking problem saying that he only drinks when he feels nervous which she has been feeling recently. Denies any current hallucinations. Denies suicidal thoughts. Patient is calm and cooperative and polite right now.  Social history: Currently living with his mother. Doesn't seem to be working right now.  Medical history: History of recurrent presentations with alcohol abuse. He denies any history of seizures or delirium tremens and we don't have any documentation to the contrary.  Substance abuse history: Long history of alcohol abuse. Multiple presentations here intoxicated. Patient minimizes it and does not seem to have any willingness to engage in substance abuse treatment. Denies that he is abusing any other drugs.  Past Psychiatric History: Patient denies any history of suicidal behavior. Denies any history of psychosis and denies any history of violence. He denies the allegations in the petition from his mother. Not currently taking any medicine.  Associated Signs/Symptoms: Depression Symptoms:  denies despair, hopelessness, helplessness, worthlessness (Hypo) Manic Symptoms:  N/A Anxiety Symptoms:  denies, worry, agoraphobia, poor concentration, or agitation Psychotic Symptoms:  n/a PTSD Symptoms: Negative Total Time spent with patient: 20 minutes  Past Psychiatric History: See HPI  Is the patient at risk to self? Yes.    Has the patient been a risk to self in the past 6 months? Yes.    Has the patient been a risk to self within the distant past? Yes.    Is the patient a risk to others? No.  Has the patient been a risk to others in the past 6 months? No.  Has  the patient been a risk to others within the distant past? No.   Prior Inpatient Therapy:  no Prior Outpatient Therapy:  no  Alcohol Screening:  yes Substance Abuse History in the last 12 months:   Yes.   Consequences of Substance Abuse: Legal Consequences:  IVC  Previous Psychotropic Medications: No  Psychological Evaluations: Yes  Past Medical History:  Past Medical History  Diagnosis Date  . Alcohol abuse   . Seizures [facility])     Past Surgical History  Procedure Laterality Date  . Back surgery      steel rod placed   Family Psychiatric History:Unknown for substance abuse  Social History:  History  Alcohol Use No    Comment: sober since July 2016     History  Drug Use No    Additional Social History:                           Allergies:  No Known Allergies Lab Results:  Results for orders placed or performed during the hospital encounter of 06/16/16 (from the past 48 hour(s))  Comprehensive metabolic panel     Status: Abnormal   Collection Time: 06/16/16  4:03 PM  Result Value Ref Range   Sodium 140 135 - 145 mmol/L   Potassium 3.3 (L) 3.5 - 5.1 mmol/L   Chloride 103 101 - 111 mmol/L   CO2 23 22 - 32 mmol/L   Glucose, Bld 121 (H) 65 - 99 mg/dL   BUN 9 6 - 20 mg/dL   Creatinine, Ser 0.74 0.61 - 1.24 mg/dL   Calcium 9.1 8.9 - 10.3 mg/dL   Total Protein 8.1 6.5 - 8.1 g/dL   Albumin 4.7 3.5 - 5.0 g/dL   AST 35 15 - 41 U/L   ALT 20 17 - 63 U/L   Alkaline Phosphatase 70 38 - 126 U/L   Total Bilirubin 0.7 0.3 - 1.2 mg/dL   GFR calc non Af Amer >60 >60 mL/min   GFR calc Af Amer >60 >60 mL/min    Comment: (NOTE) The eGFR has been calculated using the CKD EPI equation. This calculation has not been validated in all clinical situations. eGFR's persistently <60 mL/min signify possible Chronic Kidney Disease.    Anion gap 14 5 - 15  Ethanol     Status: Abnormal   Collection Time: 06/16/16  4:03 PM  Result Value Ref Range   Alcohol, Ethyl (B) 336 (HH) <5 mg/dL    Comment: CRITICAL RESULT CALLED TO, READ BACK BY AND VERIFIED WITH KATIE NEWSHOLME AT 1650 06/16/2016 BY TFK        LOWEST DETECTABLE LIMIT FOR SERUM ALCOHOL IS 5 mg/dL FOR MEDICAL  PURPOSES ONLY   cbc     Status: None   Collection Time: 06/16/16  4:03 PM  Result Value Ref Range   WBC 6.0 3.8 - 10.6 K/uL   RBC 4.74 4.40 - 5.90 MIL/uL   Hemoglobin 14.6 13.0 - 18.0 g/dL   HCT 42.7 40.0 - 52.0 %   MCV 90.0 80.0 - 100.0 fL   MCH 30.8 26.0 - 34.0 pg   MCHC 34.3 32.0 - 36.0 g/dL   RDW 14.2 11.5 - 14.5 %   Platelets 237 150 - 440 K/uL  Urine Drug Screen, Qualitative     Status: None   Collection Time: 06/16/16  4:41 PM  Result Value Ref Range   Tricyclic, Ur Screen NONE DETECTED NONE DETECTED   Amphetamines, Ur  Screen NONE DETECTED NONE DETECTED   MDMA (Ecstasy)Ur Screen NONE DETECTED NONE DETECTED   Cocaine Metabolite,Ur [place] NONE DETECTED NONE DETECTED   Opiate, Ur Screen NONE DETECTED NONE DETECTED   Phencyclidine (PCP) Ur S NONE DETECTED NONE DETECTED   Cannabinoid 50 Ng, Ur [place] NONE DETECTED NONE DETECTED   Barbiturates, Ur Screen NONE DETECTED NONE DETECTED   Benzodiazepine, Ur Scrn NONE DETECTED NONE DETECTED   Methadone Scn, Ur NONE DETECTED NONE DETECTED    Comment: (NOTE) 725  Tricyclics, urine               Cutoff 1000 ng/mL 200  Amphetamines, urine             Cutoff 1000 ng/mL 300  MDMA (Ecstasy), urine           Cutoff 500 ng/mL 400  Cocaine Metabolite, urine       Cutoff 300 ng/mL 500  Opiate, urine                   Cutoff 300 ng/mL 600  Phencyclidine (PCP), urine      Cutoff 25 ng/mL 700  Cannabinoid, urine              Cutoff 50 ng/mL 800  Barbiturates, urine             Cutoff 200 ng/mL 900  Benzodiazepine, urine           Cutoff 200 ng/mL 1000 Methadone, urine                Cutoff 300 ng/mL 1100 1200 The urine drug screen provides only a preliminary, unconfirmed 1300 analytical test result and should not be used for non-medical 1400 purposes. Clinical consideration and professional judgment should 1500 be applied to any positive drug screen result due to possible 1600 interfering substances. A more specific alternate chemical method 1700  must be used in order to obtain a confirmed analytical result.  1800 Gas chromato graphy / mass spectrometry (GC/MS) is the preferred 1900 confirmatory method.     Blood Alcohol level:  Lab Results  Component Value Date   ETH 336* 06/16/2016   ETH 399* 36/64/4034    Metabolic Disorder Labs:  No results found for: HGBA1C, MPG No results found for: PROLACTIN No results found for: CHOL, TRIG, HDL, CHOLHDL, VLDL, LDLCALC  Current Medications: Current Facility-Administered Medications  Medication Dose Route Frequency Provider Last Rate Last Dose  . acetaminophen (TYLENOL) tablet 650 mg  650 mg Oral Q6H PRN Laverle Hobby, PA-C      . alum & mag hydroxide-simeth (MAALOX/MYLANTA) 200-200-20 MG/5ML suspension 30 mL  30 mL Oral Q4H PRN Laverle Hobby, PA-C      . hydrOXYzine (ATARAX/VISTARIL) tablet 25 mg  25 mg Oral Q6H PRN Laverle Hobby, PA-C      . loperamide (IMODIUM) capsule 2-4 mg  2-4 mg Oral PRN Laverle Hobby, PA-C      . LORazepam (ATIVAN) tablet 1 mg  1 mg Oral Q6H PRN Laverle Hobby, PA-C      . LORazepam (ATIVAN) tablet 1 mg  1 mg Oral QID Laverle Hobby, PA-C       Followed by  . [START ON 06/18/2016] LORazepam (ATIVAN) tablet 1 mg  1 mg Oral TID Laverle Hobby, PA-C       Followed by  . [START ON 06/19/2016] LORazepam (ATIVAN) tablet 1 mg  1 mg Oral BID Laverle Hobby, PA-C  Followed by  . [START ON 06/21/2016] LORazepam (ATIVAN) tablet 1 mg  1 mg Oral Daily Stassi Fadely E Hava Massingale, PA-C      . magnesium hydroxide (MILK OF MAGNESIA) suspension 30 mL  30 mL Oral Daily PRN Laverle Hobby, PA-C      . multivitamin with minerals tablet 1 tablet  1 tablet Oral Daily Laverle Hobby, PA-C      . ondansetron (ZOFRAN-ODT) disintegrating tablet 4 mg  4 mg Oral Q6H PRN Laverle Hobby, PA-C      . thiamine (B-1) injection 100 mg  100 mg Intramuscular Once Laverle Hobby, PA-C      . [START ON 06/18/2016] thiamine (VITAMIN B-1) tablet 100 mg  100 mg Oral Daily Laverle Hobby,  PA-C      . traZODone (DESYREL) tablet 50 mg  50 mg Oral QHS,MR X 1 Laverle Hobby, PA-C       Facility-Administered Medications Ordered in Other Encounters  Medication Dose Route Frequency Provider Last Rate Last Dose  . potassium chloride SA (K-DUR,KLOR-CON) 20 MEQ CR tablet            PTA Medications: No prescriptions prior to admission    Musculoskeletal: Strength & Muscle Tone: within normal limits Gait & Station: normal Patient leans: N/A  Psychiatric Specialty Exam: Physical Exam  Nursing note and vitals reviewed. Constitutional: He appears well-developed and well-nourished.  HENT:  Head: Normocephalic.  Skin: Skin is warm and dry.    Review of Systems  Psychiatric/Behavioral: Positive for substance abuse.  All other systems reviewed and are negative.   There were no vitals taken for this visit.There is no weight on file to calculate BMI.  General Appearance: Disheveled  Eye Contact:  Fair  Speech:  Clear and Coherent  Volume:  Normal  Mood:  Negative  Affect:  Congruent  Thought Process:  Coherent  Orientation:  Full (Time, Place, and Person)  Thought Content:  NA  Suicidal Thoughts:  No  Homicidal Thoughts:  No  Memory:  Immediate;   Fair  Judgement:  Impaired  Insight:  Lacking  Psychomotor Activity:  Normal  Concentration:  Concentration: Fair  Recall:  [employer] of Knowledge:  Fair  Language:  Good  Akathisia:  Negative  Handed:  Right  AIMS (if indicated):     Assets:  Communication Skills  ADL's:  Intact  Cognition:  WNL  Sleep:         Treatment Plan Summary: Plan dmitted to [facility] for crises intervention,safety and stabilization. Reccomend referral to [facility] or [facility] due to Alcohol dependence and related mood disorder.  Observation Level/Precautions:  Continuous Observation Laboratory:   Psychotherapy:   Medications:   Consultations:   Discharge Concerns:   Estimated LOS: Other:      Canden Cieslinski E,  PA-C 6/21/201712:34 AM

## 2016-06-23 ENCOUNTER — Encounter: Payer: Self-pay | Admitting: Medical Oncology

## 2016-06-23 ENCOUNTER — Emergency Department
Admission: EM | Admit: 2016-06-23 | Discharge: 2016-06-23 | Disposition: A | Discharge status: Home / Self Care | Payer: Self-pay | Attending: Emergency Medicine | Admitting: Emergency Medicine

## 2016-06-23 DIAGNOSIS — F10929 Alcohol use, unspecified with intoxication, unspecified: Secondary | ICD-10-CM

## 2016-06-23 DIAGNOSIS — F10220 Alcohol dependence with intoxication, uncomplicated: Secondary | ICD-10-CM | POA: Insufficient documentation

## 2016-06-23 NOTE — ED Provider Notes (Addendum)
[facility] Emergency Department Provider Note  ____________________________________________   I have reviewed the triage vital signs and the nursing notes.   HISTORY  Chief Complaint Alcohol Intoxication    HPI Johnny Burgess is a [age] y.o. male presents today complaining of intoxication. To me he states he does not want to be placed in detox. Patient's quite intoxicated no other complaints no evidence of trauma no complaints of trauma. Denies any toxic ingestion aside from Listerine denies SI or HI.    Past Medical History  Diagnosis Date  . Alcohol abuse   . Seizures [facility](HCC)     Patient Active Problem List   Diagnosis Date Noted  . 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 Substance induced mood disorder ([facility]) 05/26/2016  . Involuntary commitment 05/26/2016  . Alcohol abuse 03/12/2016  . Anxiety disorder 03/12/2016    Past Surgical History  Procedure Laterality Date  . Back surgery      steel rod placed    Current Outpatient Rx  Name  Route  Sig  Dispense  Refill  . traZODone (DESYREL) 50 MG tablet   Oral   Take 1 tablet (50 mg total) by mouth at bedtime as needed for sleep.   14 tablet   0     Allergies Review of patient's allergies indicates no known allergies.  No family history on file.  Social History Social History  Substance Use Topics  . Smoking status: Never Smoker   . Smokeless tobacco: Never Used  . Alcohol Use: No     Comment: sober since July 2016    Review of Systems Constitutional: No fever/chills Eyes: No visual changes. ENT: No sore throat. No stiff neck no neck pain Cardiovascular: Denies chest pain. Respiratory: Denies shortness of breath. Gastrointestinal:   no vomiting.  No diarrhea.  No constipation. Genitourinary: Negative for dysuria. Musculoskeletal: Negative lower extremity swelling Skin: Negative for rash. Neurological: Negative for headaches, focal weakness or numbness. 10-point ROS  otherwise negative.  ____________________________________________   PHYSICAL EXAM:  VITAL SIGNS: ED Triage Vitals  Enc Vitals Group     BP 06/23/16 1818 106/73 mmHg     Pulse Rate 06/23/16 1818 74     Resp 06/23/16 1818 15     Temp 06/23/16 1818 98.1 F (36.7 C)     Temp Source 06/23/16 1818 Oral     SpO2 06/23/16 1818 99 %     Weight 06/23/16 1818 165 lb (74.844 kg)     Height --      Head Cir --      Peak Flow --      Pain Score 06/23/16 1821 0     Pain Loc --      Pain Edu? --      Excl. in GC? --     Constitutional: Asian isn't toxic and in no acute distress, somnolent but easily arousable to voice.  Eyes: Conjunctivae are normal. PERRL. EOMI. Head: Atraumatic. Nose: No congestion/rhinnorhea. Mouth/Throat: Mucous membranes are moist.  Oropharynx non-erythematous. Neck: No stridor.   Nontender with no meningismus Cardiovascular: Normal rate, regular rhythm. Grossly normal heart sounds.  Good peripheral circulation. Respiratory: Normal respiratory effort.  No retractions. Lungs CTAB. Abdominal: Soft and nontender. No distention. No guarding no rebound Back:  There is no focal tenderness or step off there is no midline tenderness there are no lesions noted. there is no CVA tenderness Musculoskeletal: No lower extremity tenderness. No joint effusions, no DVT signs strong distal pulses no  edema Neurologic:  Normal speech and language. No gross focal neurologic deficits are appreciated.  Skin:  Skin is warm, dry and intact. No rash noted. Psychiatric: Mood and affect are normal. Speech and behavior are normal.  ____________________________________________   LABS (all labs ordered are listed, but only abnormal results are displayed)  Labs Reviewed - No data to display ____________________________________________  EKG  I personally interpreted any EKGs ordered by me or triage  ____________________________________________  RADIOLOGY  I reviewed any imaging ordered  by me or triage that were performed during my shift and, if possible, patient and/or family made aware of any abnormal findings. ____________________________________________   PROCEDURES  Procedure(s) performed: None  Critical Care performed: None  ____________________________________________   INITIAL IMPRESSION / ASSESSMENT AND PLAN / ED COURSE  Pertinent labs & imaging results that were available during my care of the patient were reviewed by me and considered in my medical decision making (see chart for details).  Patient here because he is intoxicated. Has been here multiple times recently for intoxication. He saw him on the 20th in the 19th for similar. As well as it appears the 12th and 11th and the 30th and the 29th. There is no evidence of ongoing pathology today side from intoxication we will wait until he sobers up and see what he wants to do. This is not a detox facility despite the fact the patient is been her multiple times for similar complaints we are not a detox facility.Marland Kitchen.   ----------------------------------------- 10:53 PM on 06/23/2016 -----------------------------------------  Asian awake and alert and ambulatory, vitals eating and drinking walking normally no longer clinically intoxicated with like to leave. Patient in no acute distress would like to go home. We will discharge him. Clinically not intoxicated at this time. Will call for a ride.    ____________________________________________   FINAL CLINICAL IMPRESSION(S) / ED DIAGNOSES  Final diagnoses:  None      This chart was dictated using voice recognition software.  Despite best efforts to proofread,  errors can occur which can change meaning.     Jeanmarie PlantJames A Sheala Dosh, MD 06/23/16 1935  Jeanmarie PlantJames A Eddith Mentor, MD 06/23/16 [phone]

## 2016-06-23 NOTE — ED Notes (Signed)
Pt sleeping in hallway bed.

## 2016-06-23 NOTE — Discharge Instructions (Signed)
Alcohol Abuse and Nutrition  Alcohol abuse is any pattern of alcohol consumption that harms your health, relationships, or work. Alcohol abuse can affect how your body breaks down and absorbs nutrients from food by causing your liver to work abnormally. Additionally, many people who abuse alcohol do not eat enough carbohydrates, protein, fat, vitamins, and minerals. This can cause poor nutrition (malnutrition) and a lack of nutrients (nutrient deficiencies), which can lead to further complications.  Nutrients that are commonly lacking (deficient) among people who abuse alcohol include:  · Vitamins.    Vitamin A. This is stored in your liver. It is important for your vision, metabolism, and ability to fight off infections (immunity).    B vitamins. These include vitamins such as folate, thiamin, and niacin. These are important in new cell growth and maintenance.    Vitamin C. This plays an important role in iron absorption, wound healing, and immunity.    Vitamin D. This is produced by your liver, but you can also get vitamin D from food. Vitamin D is necessary for your body to absorb and use calcium.  · Minerals.    Calcium. This is important for your bones and your heart and blood vessel (cardiovascular) function.    Iron. This is important for blood, muscle, and nervous system functioning.    Magnesium. This plays an important role in muscle and nerve function, and it helps to control blood sugar and blood pressure.    Zinc. This is important for the normal function of your nervous system and digestive system (gastrointestinal tract).  Nutrition is an essential component of therapy for alcohol abuse. Your health care provider or dietitian will work with you to design a plan that can help restore nutrients to your body and prevent potential complications.  WHAT IS MY PLAN?  Your dietitian may develop a specific diet plan that is based on your condition and any other complications you may have. A diet plan will  commonly include:  · A balanced diet.    Grains: 6-8 oz per day.    Vegetables: 2-3 cups per day.    Fruits: 1-2 cups per day.    Meat and other protein: 5-6 oz per day.    Dairy: 2-3 cups per day.  · Vitamin and mineral supplements.  WHAT DO I NEED TO KNOW ABOUT ALCOHOL AND NUTRITION?  · Consume foods that are high in antioxidants, such as grapes, berries, nuts, green tea, and dark green and orange vegetables. This can help to counteract some of the stress that is placed on your liver by consuming alcohol.  · Avoid food and drinks that are high in fat and sugar. Foods such as sugared soft drinks, salty snack foods, and candy contain empty calories. This means that they lack important nutrients such as protein, fiber, and vitamins.  · Eat frequent meals and snacks. Try to eat 5-6 small meals each day.  · Eat a variety of fresh fruits and vegetables each day. This will help you get plenty of water, fiber, and vitamins in your diet.  · Drink plenty of water and other clear fluids. Try to drink at least 48-64 oz (1.5-2 L) of water per day.  · If you are a vegetarian, eat a variety of protein-rich foods. Pair whole grains with plant-based proteins at meals and snacks to obtain the greatest nutrient benefit from your food. For example, eat rice with beans, put peanut butter on whole-grain toast, or eat oatmeal with sunflower seeds.  ·   Soak beans and whole grains overnight before cooking. This can help your body to absorb the nutrients more easily.  · Include foods fortified with vitamins and minerals in your diet. Commonly fortified foods include milk, orange juice, cereal, and bread.  · If you are malnourished, your dietitian may recommend a high-protein, high-calorie diet. This may include:    2,000-3,000 calories (kilocalories) per day.    70-100 grams of protein per day.  · Your health care provider may recommend a complete nutritional supplement beverage. This can help to restore calories, protein, and vitamins to  your body. Depending on your condition, you may be advised to consume this instead of or in addition to meals.  · Limit your intake of caffeine. Replace drinks like coffee and black tea with decaffeinated coffee and herbal tea.  · Eat a variety of foods that are high in omega fatty acids. These include fish, nuts and seeds, and soybeans. These foods may help your liver to recover and may also stabilize your mood.  · Certain medicines may cause changes in your appetite, taste, and weight. Work with your health care provider and dietitian to make any adjustments to your medicines and diet plan.  · Include other healthy lifestyle choices in your daily routine.    Be physically active.    Get enough sleep.    Spend time doing activities that you enjoy.  · If you are unable to take in enough food and calories by mouth, your health care provider may recommend a feeding tube. This is a tube that passes through your nose and throat, directly into your stomach. Nutritional supplement beverages can be given to you through the feeding tube to help you get the nutrients you need.  · Take vitamin or mineral supplements as recommended by your health care provider.  WHAT FOODS CAN I EAT?  Grains  Enriched pasta. Enriched rice. Fortified whole-grain bread. Fortified whole-grain cereal. Barley. Brown rice. Quinoa. Millet.  Vegetables  All fresh, frozen, and canned vegetables. Spinach. Kale. Artichoke. Carrots. Winter squash and pumpkin. Sweet potatoes. Broccoli. Cabbage. Cucumbers. Tomatoes. Sweet peppers. Green beans. Peas. Corn.  Fruits  All fresh and frozen fruits. Berries. Grapes. Mango. Papaya. Guava. Cherries. Apples. Bananas. Peaches. Plums. Pineapple. Watermelon. Cantaloupe. Oranges. Avocado.  Meats and Other Protein Sources  Beef liver. Lean beef. Pork. Fresh and canned chicken. Fresh fish. Oysters. Sardines. Canned tuna. Shrimp. Eggs with yolks. Nuts and seeds. Peanut butter. Beans and lentils. Soybeans.  Tofu.  Dairy  Whole, low-fat, and nonfat milk. Whole, low-fat, and nonfat yogurt. Cottage cheese. Sour cream. Hard and soft cheeses.  Beverages  Water. Herbal tea. Decaffeinated coffee. Decaffeinated green tea. 100% fruit juice. 100% vegetable juice. Instant breakfast shakes.  Condiments  Ketchup. Mayonnaise. Mustard. Salad dressing. Barbecue sauce.  Sweets and Desserts  Sugar-free ice cream. Sugar-free pudding. Sugar-free gelatin.  Fats and Oils  Butter. Vegetable oil, flaxseed oil, olive oil, and walnut oil.  Other  Complete nutrition shakes. Protein bars. Sugar-free gum.  The items listed above may not be a complete list of recommended foods or beverages. Contact your dietitian for more options.  WHAT FOODS ARE NOT RECOMMENDED?  Grains  Sugar-sweetened breakfast cereals. Flavored instant oatmeal. Fried breads.  Vegetables  Breaded or deep-fried vegetables.  Fruits  Dried fruit with added sugar. Candied fruit. Canned fruit in syrup.  Meats and Other Protein Sources  Breaded or deep-fried meats.  Dairy  Flavored milks. Fried cheese curds or fried cheese sticks.  Beverages  Alcohol.   Sugar-sweetened soft drinks. Sugar-sweetened tea. Caffeinated coffee and tea.  Condiments  Sugar. Honey. Agave nectar. Molasses.  Sweets and Desserts  Chocolate. Cake. Cookies. Candy.  Other  Potato chips. Pretzels. Salted nuts. Candied nuts.  The items listed above may not be a complete list of foods and beverages to avoid. Contact your dietitian for more information.     This information is not intended to replace advice given to you by your health care provider. Make sure you discuss any questions you have with your health care provider.     Document Released: 10/08/2005 Document Revised: 01/04/2015 Document Reviewed: 07/17/2014  Elsevier Interactive Patient Education ©2016 [employer]  Alcohol Intoxication  Alcohol intoxication occurs when you drink enough alcohol that it affects your ability to function. It can be mild or very  severe. Drinking a lot of alcohol in a short time is called binge drinking. This can be very harmful. Drinking alcohol can also be more dangerous if you are taking medicines or other drugs. Some of the effects caused by alcohol may include:  · Loss of coordination.  · Changes in mood and behavior.  · Unclear thinking.  · Trouble talking (slurred speech).  · Throwing up (vomiting).  · Confusion.  · Slowed breathing.  · Twitching and shaking (seizures).  · Loss of consciousness.  HOME CARE  · Do not drive after drinking alcohol.  · Drink enough water and fluids to keep your pee (urine) clear or pale yellow. Avoid caffeine.  · Only take medicine as told by your doctor.  GET HELP IF:  · You throw up (vomit) many times.  · You do not feel better after a few days.  · You frequently have alcohol intoxication. Your doctor can help decide if you should see a substance use treatment counselor.  GET HELP RIGHT AWAY IF:  · You become shaky when you stop drinking.  · You have twitching and shaking.  · You throw up blood. It may look bright red or like coffee grounds.  · You notice blood in your poop (bowel movements).  · You become lightheaded or pass out (faint).  MAKE SURE YOU:   · Understand these instructions.  · Will watch your condition.  · Will get help right away if you are not doing well or get worse.     This information is not intended to replace advice given to you by your health care provider. Make sure you discuss any questions you have with your health care provider.     Document Released: 06/01/2008 Document Revised: 08/16/2013 Document Reviewed: 05/19/2013  Elsevier Interactive Patient Education ©2016 [employer]

## 2016-06-23 NOTE — ED Notes (Signed)
Pt awake asking for something to eat. Confirmed with Amy RN to make sure pt could eat. She said it was fine. Gave pt meal tray and ginger ale. Pt alert in bed eating.

## 2016-06-23 NOTE — ED Notes (Signed)
Pt calm and cooperative.

## 2016-06-23 NOTE — ED Notes (Addendum)
Pt was found in the [facility] drinking listerine. Staff called 911. Pt denies SI. Reports he wants detox. Was here 2 times last week for same. Pt in voluntary.

## 2016-07-08 NOTE — Discharge Summary (Signed)
[facility] OBS UNIT DISCHARGE SUMMARY  Patient Identification: Johnny Burgess MRN:  [id number] Date of Evaluation:  06/17/2016 Chief Complaint:  Alcohol Abuse Principal Diagnosis: Alcohol-induced mood disorder (Johnny Burgess) Diagnosis:   Patient Active Problem List   Diagnosis Date Noted  . 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 Substance induced mood disorder (Johnny Burgess) [F19.94] 05/26/2016  . Involuntary commitment [Z04.6] 05/26/2016  . Alcohol abuse [F10.10] 03/12/2016  . Anxiety disorder [F41.9] 03/12/2016   Subjective: Pt seen and chart reviewed. Pt is alert/oriented x4, calm, cooperative, and appropriate to situation. Pt denies suicidal/homicidal ideation and psychosis and does not appear to be responding to internal stimuli. Pt reports he no longer wants inpatient detox, but that he wants to followup on an outpatient basis.   History of Present Illness: I have reviewed and concur with HPI elements below, modified as follows: Patient is a 35 y/o male accepted from [facility] after repeated ED visit due to alcohol intoxication and related mood disorder. The patient's mother filed IVC paper work due to reported bizarre behaviors, but the patient is no under IVC orders at this time. Patient is without a prior psychiatric diagnosis other than alcohol abuse, denies suicidality, homicidality, AVH, paranoia or delusional thoughts. Earlier assessment completed below, endorses the chronic nature of Johnny Burgess's alcohol abuse/dependence.  Patient interviewed. Chart reviewed. Labs and vitals reviewed. 35 year old man was just discharged from the emergency room this morning after being brought in intoxicated last night. His mother filed commitment papers today stating that he is acting bizarre and agitated and that he tried to jump out of a car. Patient tells me that he left this morning, went out for lunch, went back to his mother's house and then she called the police. He tends to minimize  everything. He is rather evasive in his conversation part of which is probably due to his being intoxicated. Patient denied to me that he had had any alcohol since leaving this morning but this is clearly not true. His current blood alcohol level was 366 which would not be possible if he had not been drinking today. Patient denies that he is using any other drugs. He says that his mood has not been particularly depressed. He denies suicidal or homicidal ideation. Denies any wish to harm himself. As usual he minimizes his drinking problem saying that he only drinks when he feels nervous which she has been feeling recently. Denies any current hallucinations. Denies suicidal thoughts. Patient is calm and cooperative and polite right now.  Pt spent the night in [place] without incident. He initially wanted inpatient rehab but later declined these referrals.  Total Time spent with patient: 35 minutes  Past Psychiatric History: See HPI  Prior Inpatient Therapy:  no Prior Outpatient Therapy:  no  Alcohol Screening: Patient refused Alcohol Screening Tool: Yes 1. How often do you have a drink containing alcohol?: 2 to 3 times a week 2. How many drinks containing alcohol do you have on a typical day when you are drinking?: 7, 8, or 9 3. How often do you have six or more drinks on one occasion?: Weekly Preliminary Score: 6 4. How often during the last year have you found that you were not able to stop drinking once you had started?: Daily or almost daily 5. How often during the last year have you failed to do what was normally expected from you becasue of drinking?: Daily or almost daily 6. How often during the last  year have you needed a first drink in the morning to get yourself going after a heavy drinking session?: Weekly 7. How often during the last year have you had a feeling of guilt of remorse after drinking?: Weekly 8. How often during the last year have you been unable to remember what  happened the night before because you had been drinking?: Weekly 9. Have you or someone else been injured as a result of your drinking?: No 10. Has a relative or friend or a doctor or another health worker been concerned about your drinking or suggested you cut down?: Yes, during the last year Alcohol Use Disorder Identification Test Final Score (AUDIT): 30 Brief Intervention: Patient declined brief interventionyes Substance Abuse History in the last 12 months:  Yes.   Consequences of Substance Abuse: Legal Consequences:  IVC  Previous Psychotropic Medications: No  Psychological Evaluations: Yes  Past Medical History:  Past Medical History  Diagnosis Date  . Alcohol abuse   . Seizures [facility])     Past Surgical History  Procedure Laterality Date  . Back surgery      steel rod placed   Family Psychiatric History:Unknown for substance abuse  Social History:  History  Alcohol Use No    Comment: sober since July 2016     History  Drug Use No    Additional Social History:      Pain Medications: See PTA  Prescriptions: See PTA  Over the Counter: See PTA  History of alcohol / drug use?: Yes Longest period of sobriety (when/how long): 6 months Negative Consequences of Use: Financial, Personal relationships Withdrawal Symptoms: Sweats, Irritability Name of Substance 1: Alcohol 1 - Age of First Use: 19 1 - Amount (size/oz): fifth of vodka 1 - Frequency: 3-4 times a week 1 - Last Use / Amount: 03/16/2016                  Allergies:  No Known Allergies Lab Results:  Results for orders placed or performed during the hospital encounter of 06/16/16 (from the past 48 hour(s))  Comprehensive metabolic panel     Status: Abnormal   Collection Time: 06/16/16  4:03 PM  Result Value Ref Range   Sodium 140 135 - 145 mmol/L   Potassium 3.3 (L) 3.5 - 5.1 mmol/L   Chloride 103 101 - 111 mmol/L   CO2 23 22 - 32 mmol/L   Glucose, Bld 121 (H) 65 - 99 mg/dL   BUN 9 6 - 20 mg/dL    Creatinine, Ser 0.74 0.61 - 1.24 mg/dL   Calcium 9.1 8.9 - 10.3 mg/dL   Total Protein 8.1 6.5 - 8.1 g/dL   Albumin 4.7 3.5 - 5.0 g/dL   AST 35 15 - 41 U/L   ALT 20 17 - 63 U/L   Alkaline Phosphatase 70 38 - 126 U/L   Total Bilirubin 0.7 0.3 - 1.2 mg/dL   GFR calc non Af Amer >60 >60 mL/min   GFR calc Af Amer >60 >60 mL/min    Comment: (NOTE) The eGFR has been calculated using the CKD EPI equation. This calculation has not been validated in all clinical situations. eGFR's persistently <60 mL/min signify possible Chronic Kidney Disease.    Anion gap 14 5 - 15  Ethanol     Status: Abnormal   Collection Time: 06/16/16  4:03 PM  Result Value Ref Range   Alcohol, Ethyl (B) 336 (HH) <5 mg/dL    Comment: CRITICAL RESULT CALLED TO, READ BACK  BY AND VERIFIED WITH KATIE NEWSHOLME AT 6144 06/16/2016 BY TFK        LOWEST DETECTABLE LIMIT FOR SERUM ALCOHOL IS 5 mg/dL FOR MEDICAL PURPOSES ONLY   cbc     Status: None   Collection Time: 06/16/16  4:03 PM  Result Value Ref Range   WBC 6.0 3.8 - 10.6 K/uL   RBC 4.74 4.40 - 5.90 MIL/uL   Hemoglobin 14.6 13.0 - 18.0 g/dL   HCT 42.7 40.0 - 52.0 %   MCV 90.0 80.0 - 100.0 fL   MCH 30.8 26.0 - 34.0 pg   MCHC 34.3 32.0 - 36.0 g/dL   RDW 14.2 11.5 - 14.5 %   Platelets 237 150 - 440 K/uL  Urine Drug Screen, Qualitative     Status: None   Collection Time: 06/16/16  4:41 PM  Result Value Ref Range   Tricyclic, Ur Screen NONE DETECTED NONE DETECTED   Amphetamines, Ur Screen NONE DETECTED NONE DETECTED   MDMA (Ecstasy)Ur Screen NONE DETECTED NONE DETECTED   Cocaine Metabolite,Ur [place] NONE DETECTED NONE DETECTED   Opiate, Ur Screen NONE DETECTED NONE DETECTED   Phencyclidine (PCP) Ur S NONE DETECTED NONE DETECTED   Cannabinoid 50 Ng, Ur [place] NONE DETECTED NONE DETECTED   Barbiturates, Ur Screen NONE DETECTED NONE DETECTED   Benzodiazepine, Ur Scrn NONE DETECTED NONE DETECTED   Methadone Scn, Ur NONE DETECTED NONE DETECTED    Comment: (NOTE) 315   Tricyclics, urine               Cutoff 1000 ng/mL 200  Amphetamines, urine             Cutoff 1000 ng/mL 300  MDMA (Ecstasy), urine           Cutoff 500 ng/mL 400  Cocaine Metabolite, urine       Cutoff 300 ng/mL 500  Opiate, urine                   Cutoff 300 ng/mL 600  Phencyclidine (PCP), urine      Cutoff 25 ng/mL 700  Cannabinoid, urine              Cutoff 50 ng/mL 800  Barbiturates, urine             Cutoff 200 ng/mL 900  Benzodiazepine, urine           Cutoff 200 ng/mL 1000 Methadone, urine                Cutoff 300 ng/mL 1100 1200 The urine drug screen provides only a preliminary, unconfirmed 1300 analytical test result and should not be used for non-medical 1400 purposes. Clinical consideration and professional judgment should 1500 be applied to any positive drug screen result due to possible 1600 interfering substances. A more specific alternate chemical method 1700 must be used in order to obtain a confirmed analytical result.  1800 Gas chromato graphy / mass spectrometry (GC/MS) is the preferred 1900 confirmatory method.     Blood Alcohol level:  Lab Results  Component Value Date   ETH 336* 06/16/2016   ETH 399* 40/07/6760    Metabolic Disorder Labs:  No results found for: HGBA1C, MPG No results found for: PROLACTIN No results found for: CHOL, TRIG, HDL, CHOLHDL, VLDL, LDLCALC  Current Medications: Current Facility-Administered Medications  Medication Dose Route Frequency Provider Last Rate Last Dose  . acetaminophen (TYLENOL) tablet 650 mg  650 mg Oral Q6H PRN Laverle Hobby, PA-C      .  alum & mag hydroxide-simeth (MAALOX/MYLANTA) 200-200-20 MG/5ML suspension 30 mL  30 mL Oral Q4H PRN Laverle Hobby, PA-C      . hydrOXYzine (ATARAX/VISTARIL) tablet 25 mg  25 mg Oral Q6H PRN Laverle Hobby, PA-C   25 mg at 06/17/16 0101  . loperamide (IMODIUM) capsule 2-4 mg  2-4 mg Oral PRN Laverle Hobby, PA-C      . LORazepam (ATIVAN) tablet 1 mg  1 mg Oral Q6H PRN  Laverle Hobby, PA-C      . LORazepam (ATIVAN) tablet 1 mg  1 mg Oral QID Laverle Hobby, PA-C   1 mg at 06/17/16 0857   Followed by  . [START ON 06/18/2016] LORazepam (ATIVAN) tablet 1 mg  1 mg Oral TID Laverle Hobby, PA-C       Followed by  . [START ON 06/19/2016] LORazepam (ATIVAN) tablet 1 mg  1 mg Oral BID Laverle Hobby, PA-C       Followed by  . [START ON 06/21/2016] LORazepam (ATIVAN) tablet 1 mg  1 mg Oral Daily Spencer E Simon, PA-C      . magnesium hydroxide (MILK OF MAGNESIA) suspension 30 mL  30 mL Oral Daily PRN Laverle Hobby, PA-C      . multivitamin with minerals tablet 1 tablet  1 tablet Oral Daily Laverle Hobby, PA-C   1 tablet at 06/17/16 0857  . ondansetron (ZOFRAN-ODT) disintegrating tablet 4 mg  4 mg Oral Q6H PRN Laverle Hobby, PA-C      . thiamine (B-1) injection 100 mg  100 mg Intramuscular Once Laverle Hobby, PA-C   100 mg at 06/17/16 0105  . [START ON 06/18/2016] thiamine (VITAMIN B-1) tablet 100 mg  100 mg Oral Daily Laverle Hobby, PA-C      . traZODone (DESYREL) tablet 50 mg  50 mg Oral QHS,MR X 1 Spencer E Simon, PA-C   50 mg at 06/17/16 0101   PTA Medications: No prescriptions prior to admission    Musculoskeletal: Strength & Muscle Tone: within normal limits Gait & Station: normal Patient leans: N/A  Psychiatric Specialty Exam: Physical Exam  Nursing note and vitals reviewed. Constitutional: He appears well-developed and well-nourished.  HENT:  Head: Normocephalic.  Skin: Skin is warm and dry.    Review of Systems  Psychiatric/Behavioral: Positive for substance abuse.  All other systems reviewed and are negative.   Blood pressure 140/72, pulse 75, temperature 98 F (36.7 C), temperature source Oral, resp. rate 18, height 5' 10"  (1.778 m), weight 74.39 kg (164 lb), SpO2 98 %.Body mass index is 23.53 kg/(m^2).  General Appearance: Casual and Fairly Groomed  Eye Contact:  Good  Speech:  Clear and Coherent  Volume:  Normal  Mood:   Euthymic  Affect:  Appropriate and Congruent  Thought Process:  Coherent  Orientation:  Full (Time, Place, and Person)  Thought Content:  Discharge plans  Suicidal Thoughts:  No  Homicidal Thoughts:  No  Memory:  Immediate;   Fair  Judgement:  Fair  Insight:  Fair  Psychomotor Activity:  Normal  Concentration:  Concentration: Good and Attention Span: Good  Recall:  [employer] of Knowledge:  Fair  Language:  Good  Akathisia:  No  Handed:  Right  AIMS (if indicated):     Assets:  Communication Skills  ADL's:  Intact  Cognition:  WNL  Sleep:      Treatment Plan Summary: Alcohol-induced mood disorder ([place]), stable for outpatient management.  Pt was referred to numerous rehab facilities, but reports that he no longer wants inpatient rehab services.   Disposition: -Discharge home with outpatient resources for psychiatry/counseling  Benjamine Mola, FNP 6/21/20179:39 AM

## 2016-08-22 ENCOUNTER — Emergency Department: Payer: Self-pay

## 2016-08-22 ENCOUNTER — Encounter: Payer: Self-pay | Admitting: Emergency Medicine

## 2016-08-22 ENCOUNTER — Emergency Department
Admission: EM | Admit: 2016-08-22 | Discharge: 2016-08-23 | Disposition: A | Discharge status: Home / Self Care | Payer: Self-pay | Attending: Emergency Medicine | Admitting: Emergency Medicine

## 2016-08-22 DIAGNOSIS — Z5181 Encounter for therapeutic drug level monitoring: Secondary | ICD-10-CM | POA: Insufficient documentation

## 2016-08-22 DIAGNOSIS — F10929 Alcohol use, unspecified with intoxication, unspecified: Secondary | ICD-10-CM

## 2016-08-22 DIAGNOSIS — R4020 Unspecified coma: Secondary | ICD-10-CM | POA: Insufficient documentation

## 2016-08-22 DIAGNOSIS — F1010 Alcohol abuse, uncomplicated: Secondary | ICD-10-CM

## 2016-08-22 DIAGNOSIS — F10120 Alcohol abuse with intoxication, uncomplicated: Secondary | ICD-10-CM | POA: Insufficient documentation

## 2016-08-22 LAB — BLOOD GAS, VENOUS
ACID-BASE EXCESS: 3.1 mmol/L — AB (ref 0.0–3.0)
BICARBONATE: 30.2 meq/L — AB (ref 21.0–28.0)
O2 SAT: 96.4 %
PATIENT TEMPERATURE: 37
pCO2, Ven: 56 mmHg (ref 44.0–60.0)
pH, Ven: 7.34 (ref 7.320–7.430)
pO2, Ven: 90 mmHg — ABNORMAL HIGH (ref 31.0–45.0)

## 2016-08-22 LAB — CBC WITH DIFFERENTIAL/PLATELET
Basophils Absolute: 0.1 10*3/uL (ref 0–0.1)
Basophils Relative: 1 %
Eosinophils Absolute: 0.2 10*3/uL (ref 0–0.7)
Eosinophils Relative: 2 %
HCT: 41.2 % (ref 40.0–52.0)
HEMOGLOBIN: 13.9 g/dL (ref 13.0–18.0)
LYMPHS ABS: 1.6 10*3/uL (ref 1.0–3.6)
LYMPHS PCT: 15 %
MCH: 31.4 pg (ref 26.0–34.0)
MCHC: 33.8 g/dL (ref 32.0–36.0)
MCV: 93.1 fL (ref 80.0–100.0)
Monocytes Absolute: 1.1 10*3/uL — ABNORMAL HIGH (ref 0.2–1.0)
Monocytes Relative: 10 %
NEUTROS PCT: 72 %
Neutro Abs: 7.9 10*3/uL — ABNORMAL HIGH (ref 1.4–6.5)
Platelets: 224 10*3/uL (ref 150–440)
RBC: 4.42 MIL/uL (ref 4.40–5.90)
RDW: 14.9 % — ABNORMAL HIGH (ref 11.5–14.5)
WBC: 10.9 10*3/uL — AB (ref 3.8–10.6)

## 2016-08-22 LAB — ETHANOL: Alcohol, Ethyl (B): 471 mg/dL (ref <5)

## 2016-08-22 LAB — COMPREHENSIVE METABOLIC PANEL
ALK PHOS: 71 U/L (ref 38–126)
ALT: 14 U/L — AB (ref 17–63)
ANION GAP: 6 (ref 5–15)
AST: 22 U/L (ref 15–41)
Albumin: 3.9 g/dL (ref 3.5–5.0)
BUN: 19 mg/dL (ref 6–20)
CALCIUM: 7.8 mg/dL — AB (ref 8.9–10.3)
CO2: 28 mmol/L (ref 22–32)
Chloride: 109 mmol/L (ref 101–111)
Creatinine, Ser: 0.85 mg/dL (ref 0.61–1.24)
GLUCOSE: 99 mg/dL (ref 65–99)
Potassium: 4.2 mmol/L (ref 3.5–5.1)
Sodium: 143 mmol/L (ref 135–145)
Total Bilirubin: 0.2 mg/dL — ABNORMAL LOW (ref 0.3–1.2)
Total Protein: 6.9 g/dL (ref 6.5–8.1)

## 2016-08-22 LAB — URINALYSIS COMPLETE WITH MICROSCOPIC (ARMC ONLY)
BACTERIA UA: NONE SEEN
BILIRUBIN URINE: NEGATIVE
Glucose, UA: NEGATIVE mg/dL
HGB URINE DIPSTICK: NEGATIVE
Ketones, ur: NEGATIVE mg/dL
LEUKOCYTES UA: NEGATIVE
NITRITE: NEGATIVE
PH: 5 (ref 5.0–8.0)
Protein, ur: NEGATIVE mg/dL
RBC / HPF: NONE SEEN RBC/hpf (ref 0–5)
Specific Gravity, Urine: 1.005 (ref 1.005–1.030)
Squamous Epithelial / LPF: NONE SEEN

## 2016-08-22 LAB — URINE DRUG SCREEN, QUALITATIVE (ARMC ONLY)
Amphetamines, Ur Screen: NOT DETECTED
BARBITURATES, UR SCREEN: NOT DETECTED
BENZODIAZEPINE, UR SCRN: POSITIVE — AB
Cannabinoid 50 Ng, Ur ~~LOC~~: NOT DETECTED
Cocaine Metabolite,Ur ~~LOC~~: NOT DETECTED
MDMA (Ecstasy)Ur Screen: NOT DETECTED
METHADONE SCREEN, URINE: NOT DETECTED
OPIATE, UR SCREEN: NOT DETECTED
Phencyclidine (PCP) Ur S: NOT DETECTED
TRICYCLIC, UR SCREEN: NOT DETECTED

## 2016-08-22 LAB — SALICYLATE LEVEL
Salicylate Lvl: 6 mg/dL (ref 2.8–30.0)
Salicylate Lvl: <4 mg/dL (ref 2.8–30.0)

## 2016-08-22 LAB — ACETAMINOPHEN LEVEL
Acetaminophen (Tylenol), Serum: <10 ug/mL — ABNORMAL LOW (ref 10–30)
Acetaminophen (Tylenol), Serum: <10 ug/mL — ABNORMAL LOW (ref 10–30)

## 2016-08-22 LAB — TROPONIN I: Troponin I: <0.03 ng/mL (ref <0.03)

## 2016-08-22 MED ORDER — NALOXONE HCL 2 MG/2ML IJ SOSY
1.0000 mg | PREFILLED_SYRINGE | Freq: Once | INTRAMUSCULAR | Status: AC
  Administered 2016-08-22: 1 mg via INTRAVENOUS

## 2016-08-22 MED ORDER — NALOXONE HCL 2 MG/2ML IJ SOSY
PREFILLED_SYRINGE | INTRAMUSCULAR | Status: AC
  Administered 2016-08-22: 1 mg via INTRAVENOUS
  Filled 2016-08-22: qty 2

## 2016-08-22 MED ORDER — SODIUM CHLORIDE 0.9 % IV SOLN
Freq: Once | INTRAVENOUS | Status: AC
  Administered 2016-08-22: 18:00:00 via INTRAVENOUS

## 2016-08-22 NOTE — ED Notes (Signed)
This nurse spoke with poison control for an update about patient.  Poison control recommended repeat EKG and ASA level.

## 2016-08-22 NOTE — ED Notes (Signed)
Pt currently lying in bed resting with eyes closed at this time. Pt is not making any efforts to get out of bed at this time

## 2016-08-22 NOTE — ED Notes (Signed)
Pt attempting to get out of bed.  Pt situated back in bed by this nurse.  When speaking to patient, patient makes moaning noise and opens eyes and went right back to sleep.  Pt removed nasal trumpet on his own.  Fall mats, yellow socks, yellow wrist band, fall alarm in place and door open to be easily observed.

## 2016-08-22 NOTE — ED Triage Notes (Signed)
Pt found in bushes by police with empty bottle listerine. Pt was unresponsive.

## 2016-08-22 NOTE — ED Notes (Signed)
Dr Darnelle Catalanmalinda notified ETOH [id number]

## 2016-08-22 NOTE — ED Notes (Signed)
Pt currently lying in bed asleep with no signs of distress. Pt not currently pulling at tubing or wires and pt is not actively attempting to get out of bed. Pt environment is safe

## 2016-08-22 NOTE — ED Provider Notes (Signed)
[facility] Emergency Department Provider Note   ____________________________________________   First MD Initiated Contact with Patient 08/22/16 1737     (approximate)  I have reviewed the triage vital signs and the nursing notes.   HISTORY  Chief Complaint Alcohol Intoxication  History limited by alcohol intoxication HPI Johnny Burgess is a 35 y.o. male was found in the bushes barely conscious. Patient had a large bottle of Listerine in his hand which was almost completely empty. He apparently drank it is he smelled of blistering. Patient reportedly was kicked out of the house by his stepfather yesterday. Patient is completely unable to give a history of disease unconscious.   Past Medical History:  Diagnosis Date  . Alcohol abuse   . Seizures [facility])     Patient Active Problem List   Diagnosis Date Noted  . 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 Substance induced mood disorder (HCC) 05/26/2016  . Involuntary commitment 05/26/2016  . Alcohol abuse 03/12/2016  . Anxiety disorder 03/12/2016    Past Surgical History:  Procedure Laterality Date  . BACK SURGERY     steel rod placed    Prior to Admission medications   Medication Sig Start Date End Date Taking? Authorizing Provider  sertraline (ZOLOFT) 25 MG tablet Take 25 mg by mouth daily.    Historical Provider, MD  traZODone (DESYREL) 50 MG tablet Take 1 tablet (50 mg total) by mouth at bedtime as needed for sleep. Patient not taking: Reported on 06/23/2016 06/17/16   Beau Fanny, FNP    Allergies Other  History reviewed. No pertinent family history.  Social History Social History  Substance Use Topics  . Smoking status: Never Smoker  . Smokeless tobacco: Never Used  . Alcohol use No     Comment: sober since July 2016    Review of Systems Unable to obtain  ____________________________________________   PHYSICAL EXAM:  VITAL SIGNS: ED Triage Vitals [08/22/16  1755]  Enc Vitals Group     BP (!) 95/54     Pulse Rate (!) 106     Resp 20     Temp      Temp src      SpO2 (!) 88 %     Weight 170 lb (77.1 kg)     Height 6' (1.829 m)     Head Circumference      Peak Flow      Pain Score      Pain Loc      Pain Edu?      Excl. in GC?     Constitutional: Patient is patient unconscious barely breathing Eyes: Pupils are dilated Head: There is some abrasions on his head and one area about the size of a quarter which appears to be skin Nose: No congestion/rhinnorhea. Mouth/Throat: Mucous membranes are moist.  Oropharynx non-erythematous. Neck: No stridor.   Cardiovascular: Normal rate, regular rhythm. Grossly normal heart sounds.  Good peripheral circulation. Respiratory: Normal respiratory effort.  No retractions. Lungs CTAB. Gastrointestinal: Soft and nontender. No distention. No abdominal bruits. No CVA tenderness. Musculoskeletal: Patient has no edema his right leg is in a walking cast boot She is breath smells of Listerine.  ____________________________________________   LABS (all labs ordered are listed, but only abnormal results are displayed)  Labs Reviewed  COMPREHENSIVE METABOLIC PANEL - Abnormal; Notable for the following:       Result Value   Calcium 7.8 (*)    ALT 14 (*)  Total Bilirubin 0.2 (*)    All other components within normal limits  ETHANOL - Abnormal; Notable for the following:    Alcohol, Ethyl (B) 471 (*)    All other components within normal limits  CBC WITH DIFFERENTIAL/PLATELET - Abnormal; Notable for the following:    WBC 10.9 (*)    RDW 14.9 (*)    Neutro Abs 7.9 (*)    Monocytes Absolute 1.1 (*)    All other components within normal limits  BLOOD GAS, VENOUS - Abnormal; Notable for the following:    pO2, Ven 90.0 (*)    Bicarbonate 30.2 (*)    Acid-Base Excess 3.1 (*)    All other components within normal limits  ACETAMINOPHEN LEVEL - Abnormal; Notable for the following:    Acetaminophen  (Tylenol), Serum <10 (*)    All other components within normal limits  URINALYSIS COMPLETEWITH MICROSCOPIC ([facility] ONLY) - Abnormal; Notable for the following:    Color, Urine COLORLESS (*)    APPearance CLEAR (*)    All other components within normal limits  URINE DRUG SCREEN, QUALITATIVE ([facility] ONLY) - Abnormal; Notable for the following:    Benzodiazepine, Ur Scrn POSITIVE (*)    All other components within normal limits  TROPONIN I  SALICYLATE LEVEL  SALICYLATE LEVEL  ACETAMINOPHEN LEVEL   ____________________________________________  EKG  EKG read and interpreted by me shows sinus tachycardia at a rate of 105 right axis possible ventricular hypertrophy although the gentleman is not obese and may be why he has large QRS, possible ____________________________________________  RADIOLOGY CLINICAL DATA:  Patient was found unresponsive. Elevated alcohol levels.  EXAM: CT HEAD WITHOUT CONTRAST  CT CERVICAL SPINE WITHOUT CONTRAST  TECHNIQUE: Multidetector CT imaging of the head and cervical spine was performed following the standard protocol without intravenous contrast. Multiplanar CT image reconstructions of the cervical spine were also generated.  COMPARISON:  CT head 05/04/2013  FINDINGS: CT HEAD FINDINGS  Ventricles and sulci appear symmetrical. No ventricular dilatation. No mass effect or midline shift. No abnormal extra-axial fluid collections. Gray-white matter junctions are distinct. Basal cisterns are not effaced. No evidence of acute intracranial hemorrhage. No depressed skull fractures. Mild mucosal thickening in the paranasal sinuses. Mastoid air cells are not opacified.  CT CERVICAL SPINE FINDINGS  Normal alignment of the cervical spine. No vertebral compression deformities. Mild hypertrophic degenerative endplate changes at C3-4. Intervertebral disc space heights are mostly maintained. No prevertebral soft tissue swelling. Nasopharyngeal airway  is in place. No focal bone lesion or bone destruction. C1-2 articulation appears intact. No significant soft tissue infiltration.  IMPRESSION: No acute intracranial abnormalities.  Normal alignment of the cervical spine. No acute displaced fractures identified.   Electronically Signed   By: Burman NievesWilliam  Stevens M.D.   On: 08/22/2016 18:48   ____________________________________________   PROCEDURES  Procedure(s) performed:  Procedures  Critical Care performed:  ____________________________________________   INITIAL IMPRESSION / ASSESSMENT AND PLAN / ED COURSE  Pertinent labs & imaging results that were available during my care of the patient were reviewed by me and considered in my medical decision making (see chart for details).    Clinical Course     ____________________________________________   FINAL CLINICAL IMPRESSION(S) / ED DIAGNOSES  Final diagnoses:  Alcohol intoxication, with unspecified complication (HCC)      NEW MEDICATIONS STARTED DURING THIS VISIT:  New Prescriptions   No medications on file     Note:  This document was prepared using Dragon voice recognition software and may include  unintentional dictation errors.    Arnaldo Natal, MD 08/22/16 [phone]

## 2016-08-22 NOTE — ED Notes (Signed)
Returned from CT with RN, monitor, oxygen

## 2016-08-22 NOTE — [facility] Counselor (Signed)
Patient presenting to the ED for alcohol intoxication.  Pt is too intoxicated to be assessed at this time.  Pt will be assessed once medically cleared and coherent.

## 2016-08-23 LAB — ETHANOL
ALCOHOL ETHYL (B): 111 mg/dL — AB (ref <5)
ALCOHOL ETHYL (B): 39 mg/dL — AB (ref <5)

## 2016-08-23 MED ORDER — DEXTROSE-NACL 5-0.9 % IV SOLN
INTRAVENOUS | Status: DC
  Administered 2016-08-23: 01:00:00 via INTRAVENOUS

## 2016-08-23 NOTE — ED Notes (Signed)

## 2016-08-23 NOTE — ED Provider Notes (Signed)
Case discussed with the psychiatrist after his evaluation. Consult note reviewed. Patient does present to the ED significantly intoxicated with alcohol. However, despite psychiatrist recommending inpatient detox program to the patient, the patient refuses. He does have medical decision-making capacity and it is felt that it would not be justifiable to hold the patient against his will at this point to force inpatient detox. The patient has arranged outpatient detox which will start on September 8, so is encouraged to keep this appointment and continue with that. The patient agrees that he needs to avoid drinking until then and feels that he can manage. He is not in withdrawal. Medically stable. Psychiatrically stable and not an imminent danger to himself or others. We'll discharge home. IVC reversed by the psychiatrist.   Johnny CheekPhillip Riyah Bardon, MD 08/23/16 [phone]

## 2016-08-23 NOTE — ED Notes (Signed)
Pt given breakfast tray and more water. Pt awake and eating at this time. Awaiting mother call back or a ride home for patient.

## 2016-08-23 NOTE — ED Notes (Signed)
Pt mother told patient to walk home. Explained that pt cannot walk home alone due to intoxication level and risk of injury to self. Pt called mother back to explain, did not answer phone. Pt left message. Pt requesting a cab to go home. Requesting alcohol level to be repeated so he can go home without his mother as his ride. Denies having anyone else he can call at this time.

## 2016-08-23 NOTE — ED Notes (Addendum)
MD at bedside to discuss discharge with patient. Given contact number for [employer] to call. Pt states he has outpatient set up for 09/04/16. Pt updated on signs and sx of delirium tremors when detoxing and need to followup. Pt denies SI or HI.

## 2016-08-23 NOTE — ED Notes (Signed)
Report from Bryan, RN. Care assumed by this RN.  

## 2016-08-23 NOTE — ED Notes (Signed)
Patient awake and attempting to climb OOB. Staff responded to alarm. Patient reporting that he needs to go to the bathroom. Assisted by EDT at this time.

## 2016-08-23 NOTE — ED Notes (Signed)
[facility]. RN heard patient tell MD that he drank a "half a gallon of Vodka" in addition to the bottle of Listerine citing that he "thought it would give him a better rush" and because he knew that it would "give him better breath". Information communicated to attending EDP by this RN.

## 2016-08-23 NOTE — ED Notes (Signed)
Pt sleeping, arousable by physical and verbal stimulation. Pt then drifts back off to sleep. Pt unsafe to stand and ambulate for discharge at this time. Will continue to monitor.

## 2016-08-23 NOTE — ED Notes (Signed)
RN asking about patient's ETOH level. MD made aware that patient remains somnolent, however will respond to noxious stimulation. MD does not want a repeat ETOH level at this time. Order received for 1L D5NS bolus. Order to be carried by this RN.

## 2016-08-23 NOTE — ED Notes (Signed)
Mother called again, answered and gave phone to son. No details about visit were given to mother due to HIPPA restrictions. Pt mother unaware that son was in hospital. Son on phone with mother now to request ride home.

## 2016-08-23 NOTE — ED Notes (Signed)
[facility] and MD online speaking with patient at this time.

## 2016-08-23 NOTE — ED Notes (Signed)
[facility] called for patient update. Updated on repeats labs and EKG. [facility]; advised us to return call with any questions or concerns.

## 2016-08-23 NOTE — ED Notes (Signed)
TTS in to see patient. TTS representative making mention of the fact that there is no psychiatry coverage today; recommends SOC. Spoke with Scotty CourtStafford, MD - [facility] received [facility].

## 2016-08-23 NOTE — ED Notes (Signed)
Pt awake at this time requesting something to drink and cereal. Given to patient. Pt states that his mother will be the one to come and get him.

## 2016-08-23 NOTE — ED Notes (Signed)
Explained to patient etoh level has to be below 0.08 after speaking with Dr. Darnelle CatalanMalinda before patient allowed to leave hospital without ride home. Pt understands.

## 2016-08-23 NOTE — ED Notes (Signed)
BEHAVIORAL HEALTH ROUNDING Patient sleeping: YES Patient alert and oriented: Somnolent, but increasingly alert as compared to earlier in the shift. Will awaken to verbal stimulation. Coherent speech noted. Behavior appropriate: YES Describe behavior: No inappropriate or unacceptable behaviors noted at this time.  Nutrition and fluids offered: YES Toileting and hygiene offered: YES Sitter present: Interior and spatial designerBehavioral tech rounding every 15 minutes on patient to ensure safety.  Law enforcement present: Loss adjuster, charteredYES Law enforcement agency: [employer]

## 2016-08-23 NOTE — ED Notes (Signed)
Patient resting quieting with NAD noted at this time. Somnolent when aroused. No behaviors noted since care assumed by this RN at 2300. Patient finishing up IVF bolus as ordered by MD at this time. No anticipated needs. Will continue to monitor.

## 2016-08-23 NOTE — ED Notes (Signed)
Pt up to bedside commode to toilet, ambulates independently. Found 2 intact IV catheters that pt states he removed on his own. Call bell within reach, instructed on proper use if he needs to get up or needs anything. Verbalized understanding. Lights dimmed per patient request, door cracked.

## 2016-08-23 NOTE — ED Notes (Signed)
Pt eating lunch tray at this time  

## 2016-08-23 NOTE — ED Notes (Signed)
Mother called for transportation home, no answer. This RN left message requesting call back.

## 2016-08-23 NOTE — ED Notes (Signed)
Patient more awake and alert at this time. He is asking for PO fluids; water (240cc) provided per his request. Patient is also asking for a TV remote; provided. Patient updated on [facility], [facility]; verbalizes understanding and agreement. Patient with no further verbalized needs at this time. Will continue to monitor.

## 2016-08-24 ENCOUNTER — Emergency Department
Admission: EM | Admit: 2016-08-24 | Discharge: 2016-08-25 | Disposition: A | Discharge status: Home / Self Care | Payer: Self-pay | Attending: Emergency Medicine | Admitting: Emergency Medicine

## 2016-08-24 ENCOUNTER — Encounter: Payer: Self-pay | Admitting: Emergency Medicine

## 2016-08-24 DIAGNOSIS — F329 Major depressive disorder, single episode, unspecified: Secondary | ICD-10-CM | POA: Insufficient documentation

## 2016-08-24 DIAGNOSIS — F1094 Alcohol use, unspecified with alcohol-induced mood disorder: Secondary | ICD-10-CM | POA: Diagnosis present

## 2016-08-24 DIAGNOSIS — F1010 Alcohol abuse, uncomplicated: Secondary | ICD-10-CM

## 2016-08-24 DIAGNOSIS — Z79899 Other long term (current) drug therapy: Secondary | ICD-10-CM | POA: Insufficient documentation

## 2016-08-24 DIAGNOSIS — F10920 Alcohol use, unspecified with intoxication, uncomplicated: Secondary | ICD-10-CM

## 2016-08-24 DIAGNOSIS — F32A Depression, unspecified: Secondary | ICD-10-CM

## 2016-08-24 DIAGNOSIS — Z046 Encounter for general psychiatric examination, requested by authority: Secondary | ICD-10-CM

## 2016-08-24 DIAGNOSIS — F10129 Alcohol abuse with intoxication, unspecified: Secondary | ICD-10-CM | POA: Insufficient documentation

## 2016-08-24 NOTE — ED Triage Notes (Addendum)
[place]. Pt attempted suicide by overdosing on pain medications. Pt has hx of alcoholism. Pt extremely intoxicated, pt denies drug use. Pt calm and cooperative at this time.

## 2016-08-25 DIAGNOSIS — F1094 Alcohol use, unspecified with alcohol-induced mood disorder: Secondary | ICD-10-CM

## 2016-08-25 LAB — URINE DRUG SCREEN, QUALITATIVE (ARMC ONLY)
AMPHETAMINES, UR SCREEN: NOT DETECTED
Barbiturates, Ur Screen: NOT DETECTED
Benzodiazepine, Ur Scrn: POSITIVE — AB
Cannabinoid 50 Ng, Ur ~~LOC~~: NOT DETECTED
Cocaine Metabolite,Ur ~~LOC~~: NOT DETECTED
MDMA (ECSTASY) UR SCREEN: NOT DETECTED
METHADONE SCREEN, URINE: NOT DETECTED
Opiate, Ur Screen: NOT DETECTED
Phencyclidine (PCP) Ur S: NOT DETECTED
TRICYCLIC, UR SCREEN: NOT DETECTED

## 2016-08-25 LAB — CBC
HCT: 42.2 % (ref 40.0–52.0)
HEMOGLOBIN: 14.1 g/dL (ref 13.0–18.0)
MCH: 31.6 pg (ref 26.0–34.0)
MCHC: 33.5 g/dL (ref 32.0–36.0)
MCV: 94.4 fL (ref 80.0–100.0)
PLATELETS: 237 10*3/uL (ref 150–440)
RBC: 4.48 MIL/uL (ref 4.40–5.90)
RDW: 15.5 % — ABNORMAL HIGH (ref 11.5–14.5)
WBC: 6.2 10*3/uL (ref 3.8–10.6)

## 2016-08-25 LAB — COMPREHENSIVE METABOLIC PANEL
ALK PHOS: 81 U/L (ref 38–126)
ALT: 15 U/L — ABNORMAL LOW (ref 17–63)
ANION GAP: 5 (ref 5–15)
AST: 26 U/L (ref 15–41)
Albumin: 3.9 g/dL (ref 3.5–5.0)
BILIRUBIN TOTAL: 0.4 mg/dL (ref 0.3–1.2)
BUN: 12 mg/dL (ref 6–20)
CALCIUM: 8.2 mg/dL — AB (ref 8.9–10.3)
CO2: 28 mmol/L (ref 22–32)
Chloride: 113 mmol/L — ABNORMAL HIGH (ref 101–111)
Creatinine, Ser: 0.81 mg/dL (ref 0.61–1.24)
GFR calc Af Amer: >60 mL/min (ref >60)
Glucose, Bld: 99 mg/dL (ref 65–99)
POTASSIUM: 4.3 mmol/L (ref 3.5–5.1)
Sodium: 146 mmol/L — ABNORMAL HIGH (ref 135–145)
TOTAL PROTEIN: 6.8 g/dL (ref 6.5–8.1)

## 2016-08-25 LAB — ACETAMINOPHEN LEVEL

## 2016-08-25 LAB — SALICYLATE LEVEL

## 2016-08-25 LAB — ETHANOL: ALCOHOL ETHYL (B): 393 mg/dL — AB (ref <5)

## 2016-08-25 MED ORDER — THIAMINE HCL 100 MG/ML IJ SOLN
Freq: Once | INTRAVENOUS | Status: AC
  Administered 2016-08-25: 01:00:00 via INTRAVENOUS
  Filled 2016-08-25: qty 1000

## 2016-08-25 MED ORDER — SODIUM CHLORIDE 0.9 % IV BOLUS (SEPSIS)
1000.0000 mL | Freq: Once | INTRAVENOUS | Status: AC
  Administered 2016-08-25: 1000 mL via INTRAVENOUS

## 2016-08-25 NOTE — Consult Note (Signed)
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 Alcohol-induced mood disorder ([place]) [F10.94] 06/17/2016  . Substance induced mood disorder ([place]) [F19.94] 05/26/2016  . Involuntary commitment [Z04.6] 05/26/2016  . Alcohol abuse [F10.10] 03/12/2016  . Anxiety disorder [F41.9] 03/12/2016    Total Time spent with patient: 1 hour  Subjective:   Johnny Burgess is a 35 y.o. male patient admitted with "drinking alcohol".  HPI:  Patient interviewed. Chart reviewed. Labs and vitals reviewed. This 35 year old man came into the hospital. He says that he was brought by his mother. His mother filed commitment papers saying that he had overdosed on pain medicine as well. Patient had a blood alcohol level near 400 last night. He tells me that he has been binge drinking. He says that yesterday he thinks he might of drunk as much as 2750 mL of liquor although his memory is foggy. He denies that he's been using any other drugs. He denies that he overdosed or took any pills at all. He claims to have no knowledge of what his mother meant when she said that he had overdosed on pain medicine. Patient said his mood has been up and down. Denies being depressed or anxious currently. Denies any suicidal thoughts at all recently. He says he drinks maybe twice a week not every day. He says he is becoming more and more aware of what a problem it is and that he really doesn't tend to follow-up with outpatient mental health meant through the [place]. He is not currently on any prescription medicine.  Social history: Patient is a English as a second language teacher. He is waiting to try to get a [employer] pension started. Currently living in a  room in his parents house. Limited social activity.  Medical history: He's had several injuries that happened while he was intoxicated. Denies having any other ongoing active medical problems.  Substance abuse history: Well-established history of heavy alcohol abuse. He's had DTs on at least one occasion in the past and has had a seizure before although he doesn't have consistent seizures or DTs. He has resisted getting involved in appropriate substance abuse treatment outpatient.  Past Psychiatric History: Doesn't patients to the hospital for alcohol intoxication and withdrawal. Has been referred to the [place] and so far has not followed up although he says he is planning to do so this next month. He denies he's ever tried to kill himself. Family would be that saying they think that he is overdosed in the past the patient says he's never done it with any intention to harm himself.  Risk to Self: Is patient at risk for suicide?: No Risk to Others:   Prior Inpatient Therapy:   Prior Outpatient Therapy:    Past Medical History:  Past Medical History:  Diagnosis Date  . Alcohol abuse   . Seizures ([place])     Past Surgical History:  Procedure Laterality Date  . BACK SURGERY     steel rod placed   Family History: No family history on file. Family Psychiatric  History: Patient denies knowing of any family history of mental health problems or substance abuse problems Social History:  History  Alcohol Use  . Yes     History  Drug Use No    Social History   Social History  . Marital status: Single    Spouse name: N/A  . Number of children: N/A  . Years of education: N/A   Social History Main Topics  . Smoking status: Never Smoker  . Smokeless tobacco: Never Used  . Alcohol use Yes  . Drug use: No  . Sexual activity: Yes   Other Topics Concern  . None   Social History Narrative  . None   Additional Social History:    Allergies:   Allergies  Allergen Reactions  . Other  Anaphylaxis and Other (See Comments)    Pt states that he is allergic to tree nuts.      Labs:  Results for orders placed or performed during the hospital encounter of 08/24/16 (from the past 48 hour(s))  Comprehensive metabolic panel     Status: Abnormal   Collection Time: 08/24/16  9:30 PM  Result Value Ref Range   Sodium 146 (H) 135 - 145 mmol/L   Potassium 4.3 3.5 - 5.1 mmol/L   Chloride 113 (H) 101 - 111 mmol/L   CO2 28 22 - 32 mmol/L   Glucose, Bld 99 65 - 99 mg/dL   BUN 12 6 - 20 mg/dL   Creatinine, Ser 0.81 0.61 - 1.24 mg/dL   Calcium 8.2 (L) 8.9 - 10.3 mg/dL   Total Protein 6.8 6.5 - 8.1 g/dL   Albumin 3.9 3.5 - 5.0 g/dL   AST 26 15 - 41 U/L   ALT 15 (L) 17 - 63 U/L   Alkaline Phosphatase 81 38 - 126 U/L   Total Bilirubin 0.4 0.3 - 1.2 mg/dL   GFR calc non Af Amer >60 >60 mL/min   GFR calc Af Amer >60 >60 mL/min    Comment: (NOTE) The eGFR has been calculated using the CKD EPI equation. This calculation has not been validated in all clinical situations. eGFR's persistently <60 mL/min signify possible Chronic Kidney Disease.    Anion gap 5 5 - 15  Ethanol     Status: Abnormal   Collection Time: 08/24/16  9:30 PM  Result Value Ref Range   Alcohol, Ethyl (B) 393 (HH) <5 mg/dL    Comment: CRITICAL RESULT CALLED TO, READ BACK BY AND VERIFIED WITH VANESSA ASHLEY @ 0053 ON 08/25/2016 BY [facility]        LOWEST DETECTABLE LIMIT FOR SERUM ALCOHOL IS 5 mg/dL FOR MEDICAL PURPOSES ONLY   cbc     Status: Abnormal   Collection Time: 08/24/16  9:30 PM  Result Value Ref Range   WBC 6.2 3.8 - 10.6 K/uL   RBC 4.48 4.40 - 5.90 MIL/uL   Hemoglobin 14.1 13.0 - 18.0 g/dL   HCT 42.2 40.0 - 52.0 %   MCV 94.4 80.0 - 100.0 fL   MCH 31.6 26.0 - 34.0 pg   MCHC 33.5 32.0 - 36.0 g/dL   RDW 15.5 (H) 11.5 - 14.5 %   Platelets 237 150 - 440 K/uL  Acetaminophen level     Status: Abnormal   Collection Time: 08/24/16 10:33 PM  Result Value Ref Range   Acetaminophen (Tylenol), Serum <10 (L) 10  - 30 ug/mL    Comment:        THERAPEUTIC CONCENTRATIONS VARY SIGNIFICANTLY. A RANGE OF 10-30 ug/mL MAY BE AN EFFECTIVE CONCENTRATION FOR MANY PATIENTS. HOWEVER, SOME ARE BEST TREATED AT CONCENTRATIONS OUTSIDE THIS RANGE. ACETAMINOPHEN CONCENTRATIONS >150 ug/mL AT 4 HOURS AFTER INGESTION AND >50 ug/mL  AT 12 HOURS AFTER INGESTION ARE OFTEN ASSOCIATED WITH TOXIC REACTIONS.   Salicylate level     Status: None   Collection Time: 08/24/16 10:33 PM  Result Value Ref Range   Salicylate Lvl <6.7 2.8 - 30.0 mg/dL  Urine Drug Screen, Qualitative     Status: Abnormal   Collection Time: 08/25/16  1:53 AM  Result Value Ref Range   Tricyclic, Ur Screen NONE DETECTED NONE DETECTED   Amphetamines, Ur Screen NONE DETECTED NONE DETECTED   MDMA (Ecstasy)Ur Screen NONE DETECTED NONE DETECTED   Cocaine Metabolite,Ur [place] NONE DETECTED NONE DETECTED   Opiate, Ur Screen NONE DETECTED NONE DETECTED   Phencyclidine (PCP) Ur S NONE DETECTED NONE DETECTED   Cannabinoid 50 Ng, Ur [place] NONE DETECTED NONE DETECTED   Barbiturates, Ur Screen NONE DETECTED NONE DETECTED   Benzodiazepine, Ur Scrn POSITIVE (A) NONE DETECTED   Methadone Scn, Ur NONE DETECTED NONE DETECTED    Comment: (NOTE) 672  Tricyclics, urine               Cutoff 1000 ng/mL 200  Amphetamines, urine             Cutoff 1000 ng/mL 300  MDMA (Ecstasy), urine           Cutoff 500 ng/mL 400  Cocaine Metabolite, urine       Cutoff 300 ng/mL 500  Opiate, urine                   Cutoff 300 ng/mL 600  Phencyclidine (PCP), urine      Cutoff 25 ng/mL 700  Cannabinoid, urine              Cutoff 50 ng/mL 800  Barbiturates, urine             Cutoff 200 ng/mL 900  Benzodiazepine, urine           Cutoff 200 ng/mL 1000 Methadone, urine                Cutoff 300 ng/mL 1100 1200 The urine drug screen provides only a preliminary, unconfirmed 1300 analytical test result and should not be used for non-medical 1400 purposes. Clinical consideration and  professional judgment should 1500 be applied to any positive drug screen result due to possible 1600 interfering substances. A more specific alternate chemical method 1700 must be used in order to obtain a confirmed analytical result.  1800 Gas chromato graphy / mass spectrometry (GC/MS) is the preferred 1900 confirmatory method.     No current facility-administered medications for this encounter.    Current Outpatient Prescriptions  Medication Sig Dispense Refill  . sertraline (ZOLOFT) 25 MG tablet Take 25 mg by mouth daily.    . traZODone (DESYREL) 50 MG tablet Take 1 tablet (50 mg total) by mouth at bedtime as needed for sleep. (Patient not taking: Reported on 06/23/2016) 14 tablet 0    Musculoskeletal: Strength & Muscle Tone: within normal limits Gait & Station: normal Patient leans: N/A  Psychiatric Specialty Exam: Physical Exam  Nursing note and vitals reviewed. Constitutional: He appears well-developed and well-nourished.  HENT:  Head: Normocephalic and atraumatic.  Eyes: Conjunctivae are normal. Pupils are equal, round, and reactive to light.  Neck: Normal range of motion.  Cardiovascular: Regular rhythm and normal heart sounds.   Respiratory: Effort normal. No respiratory distress.  GI: Soft.  Musculoskeletal: Normal range of motion.  Neurological: He is alert.  Skin: Skin is warm and dry.  Psychiatric: Judgment normal. His affect is blunt. His speech is delayed. He is slowed. Thought content is not paranoid. Cognition and memory are normal. He expresses no suicidal ideation.    Review of Systems  Constitutional: Negative.   HENT: Negative.   Eyes: Negative.   Respiratory: Negative.   Cardiovascular: Negative.   Gastrointestinal: Negative.   Musculoskeletal: Negative.   Skin: Negative.   Neurological: Negative.   Psychiatric/Behavioral: Positive for memory loss and substance abuse. Negative for depression, hallucinations and suicidal ideas. The patient is  not nervous/anxious and does not have insomnia.     Blood pressure 106/75, pulse 69, temperature 97.8 F (36.6 C), temperature source Oral, resp. rate 18, height 5' 10"  (1.778 m), weight 74.8 kg (165 lb), SpO2 97 %.Body mass index is 23.68 kg/m.  General Appearance: Disheveled  Eye Contact:  Fair  Speech:  Slow  Volume:  Decreased  Mood:  Dysphoric  Affect:  Congruent  Thought Process:  Goal Directed  Orientation:  Full (Time, Place, and Person)  Thought Content:  Logical  Suicidal Thoughts:  No  Homicidal Thoughts:  No  Memory:  Immediate;   Good Recent;   Fair Remote;   Fair  Judgement:  Fair  Insight:  Fair  Psychomotor Activity:  Decreased  Concentration:  Concentration: Fair  Recall:  [employer] of Knowledge:  Fair  Language:  Fair  Akathisia:  No  Handed:  Right  AIMS (if indicated):     Assets:  Desire for Improvement Financial Resources/Insurance Housing Resilience Social Support  ADL's:  Intact  Cognition:  WNL  Sleep:        Treatment Plan Summary: Plan This is a 35 year old man with alcohol abuse. Came in very intoxicated last night. Subjectively and to all outward appearances has sobered up today. Lucid alert and interactive. Able to eat and drink and keep food down. Vital signs stable. No sign of delirium. Patient absolutely denies any suicidal thoughts at all. No evidence of psychosis. Patient will be taken off IVC. He can be discharged home. I have gone over with him the obvious health problems from his ongoing drinking and he tells me that he sincerely plans to follow-up and hasn't appointment at a [place] substance abuse and mental health office coming up in about 2 weeks. Case reviewed with emergency room physician.  Disposition: Patient does not meet criteria for psychiatric inpatient admission. Supportive therapy provided about ongoing stressors.  Alethia Berthold, MD 08/25/2016 11:54 AM

## 2016-08-25 NOTE — ED Provider Notes (Signed)
[facility] Emergency Department Provider Note   ____________________________________________   First MD Initiated Contact with Patient 08/25/16 0008     (approximate)  I have reviewed the triage vital signs and the nursing notes.   HISTORY  Chief Complaint Psychiatric Evaluation  Limited by intoxication  HPI Johnny Burgess is a 35 y.o. male brought to the ED under IVC by police for suicide attempts. Patient has a history of alcoholism and depression. He was seen by psychiatry just yesterday, did not want to stay for intensive detox and wanted to return home because he had arranged outpatient detox starting September 8. Evidently patient tried to attempt suicide by overdose this evening.Rest of history is unobtainable secondary to patient's intoxication.   Past Medical History:  Diagnosis Date  . Alcohol abuse   . Seizures [facility])     Patient Active Problem List   Diagnosis Date Noted  . 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 Substance induced mood disorder ([facility]) 05/26/2016  . Involuntary commitment 05/26/2016  . Alcohol abuse 03/12/2016  . Anxiety disorder 03/12/2016    Past Surgical History:  Procedure Laterality Date  . BACK SURGERY     steel rod placed    Prior to Admission medications   Medication Sig Start Date End Date Taking? Authorizing Provider  sertraline (ZOLOFT) 25 MG tablet Take 25 mg by mouth daily.    Historical Provider, MD  traZODone (DESYREL) 50 MG tablet Take 1 tablet (50 mg total) by mouth at bedtime as needed for sleep. Patient not taking: Reported on 06/23/2016 06/17/16   Beau Fanny, FNP    Allergies Other  No family history on file.  Social History Social History  Substance Use Topics  . Smoking status: Never Smoker  . Smokeless tobacco: Never Used  . Alcohol use Yes    Review of Systems  Constitutional: No fever/chills. Eyes: No visual changes. ENT: No sore throat. Cardiovascular:  Denies chest pain. Respiratory: Denies shortness of breath. Gastrointestinal: No abdominal pain.  No nausea, no vomiting.  No diarrhea.  No constipation. Genitourinary: Negative for dysuria. Musculoskeletal: Negative for back pain. Skin: Negative for rash. Neurological: Negative for headaches, focal weakness or numbness. Psychiatric:Positive for SI.  Limited by intoxication; 10-point ROS otherwise negative.  ____________________________________________   PHYSICAL EXAM:  VITAL SIGNS: ED Triage Vitals  Enc Vitals Group     BP 08/24/16 2128 129/87     Pulse Rate 08/24/16 2128 85     Resp 08/24/16 2128 18     Temp 08/24/16 2128 98.7 F (37.1 C)     Temp Source 08/24/16 2128 Oral     SpO2 08/24/16 2128 98 %     Weight 08/24/16 2129 165 lb (74.8 kg)     Height 08/24/16 2129 5\' 10"  (1.778 m)     Head Circumference --      Peak Flow --      Pain Score --      Pain Loc --      Pain Edu? --      Excl. in GC? --     Constitutional: Somnolent, intoxicated. Well appearing and in no acute distress. Eyes: Conjunctivae are normal. PERRL. EOMI. Head: Atraumatic. Nose: No congestion/rhinnorhea. Mouth/Throat: Mucous membranes are moist.  Oropharynx non-erythematous. Neck: No stridor.  No step-offs or deformities. Cardiovascular: Normal rate, regular rhythm. Grossly normal heart sounds.  Good peripheral circulation. Respiratory: Normal respiratory effort.  No retractions. Lungs CTAB. Gastrointestinal: Soft and nontender. No distention. No abdominal  bruits. No CVA tenderness. Musculoskeletal: RLE in brace. No lower extremity tenderness nor edema.  No joint effusions. Neurologic:  Intoxicated, somnolent. Moves all extremities on painful stimulation.  Skin:  Skin is warm, dry and intact. No rash noted. Psychiatric: Unable to assess secondary to intoxication. ____________________________________________   LABS (all labs ordered are listed, but only abnormal results are displayed)  Labs  Reviewed  COMPREHENSIVE METABOLIC PANEL - Abnormal; Notable for the following:       Result Value   Sodium 146 (*)    Chloride 113 (*)    Calcium 8.2 (*)    ALT 15 (*)    All other components within normal limits  ETHANOL - Abnormal; Notable for the following:    Alcohol, Ethyl (B) 393 (*)    All other components within normal limits  CBC - Abnormal; Notable for the following:    RDW 15.5 (*)    All other components within normal limits  URINE DRUG SCREEN, QUALITATIVE ([facility] ONLY) - Abnormal; Notable for the following:    Benzodiazepine, Ur Scrn POSITIVE (*)    All other components within normal limits  ACETAMINOPHEN LEVEL - Abnormal; Notable for the following:    Acetaminophen (Tylenol), Serum <10 (*)    All other components within normal limits  SALICYLATE LEVEL   ____________________________________________  EKG  None ____________________________________________  RADIOLOGY  None ____________________________________________   PROCEDURES  Procedure(s) performed: None  Procedures  Critical Care performed: No  ____________________________________________   INITIAL IMPRESSION / ASSESSMENT AND PLAN / ED COURSE  Pertinent labs & imaging results that were available during my care of the patient were reviewed by me and considered in my medical decision making (see chart for details).  [age] year old male with a history of alcohol abuse brought under IVC for suicide attempts by overdose on pain medications. Patient is heavily intoxicated, somnolent and unable to participate in interview. Will initiate IV fluid resuscitation, banana bag, placed on CIWA protocol. Patient will remain under IVC in the emergency department pending TTS and psychiatry evaluations.  Clinical Course  Comment By Time  No events overnight. Patient slept all night in no acute distress. He will need a TTS evaluation once he is awake. Patient will remain in the ED under IVC pending TTS and psychiatry  evaluations today. Irean HongJade J Sung, MD 08/29 0630     ____________________________________________   FINAL CLINICAL IMPRESSION(S) / ED DIAGNOSES  Final diagnoses:  Depression  Alcohol abuse  Alcohol intoxication, uncomplicated (HCC)      NEW MEDICATIONS STARTED DURING THIS VISIT:  New Prescriptions   No medications on file     Note:  This document was prepared using Dragon voice recognition software and may include unintentional dictation errors.    Irean HongJade J Sung, MD 08/25/16 [phone]

## 2016-08-25 NOTE — ED Provider Notes (Signed)
-----------------------------------------   12:21 PM on 08/25/2016 -----------------------------------------  Dr. Toni Amendlapacs of psychiatry has evaluated the patient who is now sober and is denying any suicidal ideation. He feels the patient is safe for discharge and the [facility]. DC with return precautions.   Gayla DossEryka A Gaylin Bulthuis, MD 08/25/16 [phone]

## 2016-08-25 NOTE — ED Notes (Signed)
Pt given 1 bag of clothes. Pt inquired about blue backpack. I looked in [facility] lock-up and there was no blue backpack or any other backpack. Pt informed. Pt stated that his mom probably had backpack. Pt was discharged.

## 2016-10-28 DEATH — deceased

## 2016-11-15 IMAGING — DX DG CHEST 1V PORT
1 series · 1 of 1 positions shown · non-contrast
Comparison: None.

CLINICAL DATA: Patient was found in bushes by police with empty
bottle Listerine. Patient is unresponsive.

EXAM:
PORTABLE CHEST 1 VIEW

[chest ap]
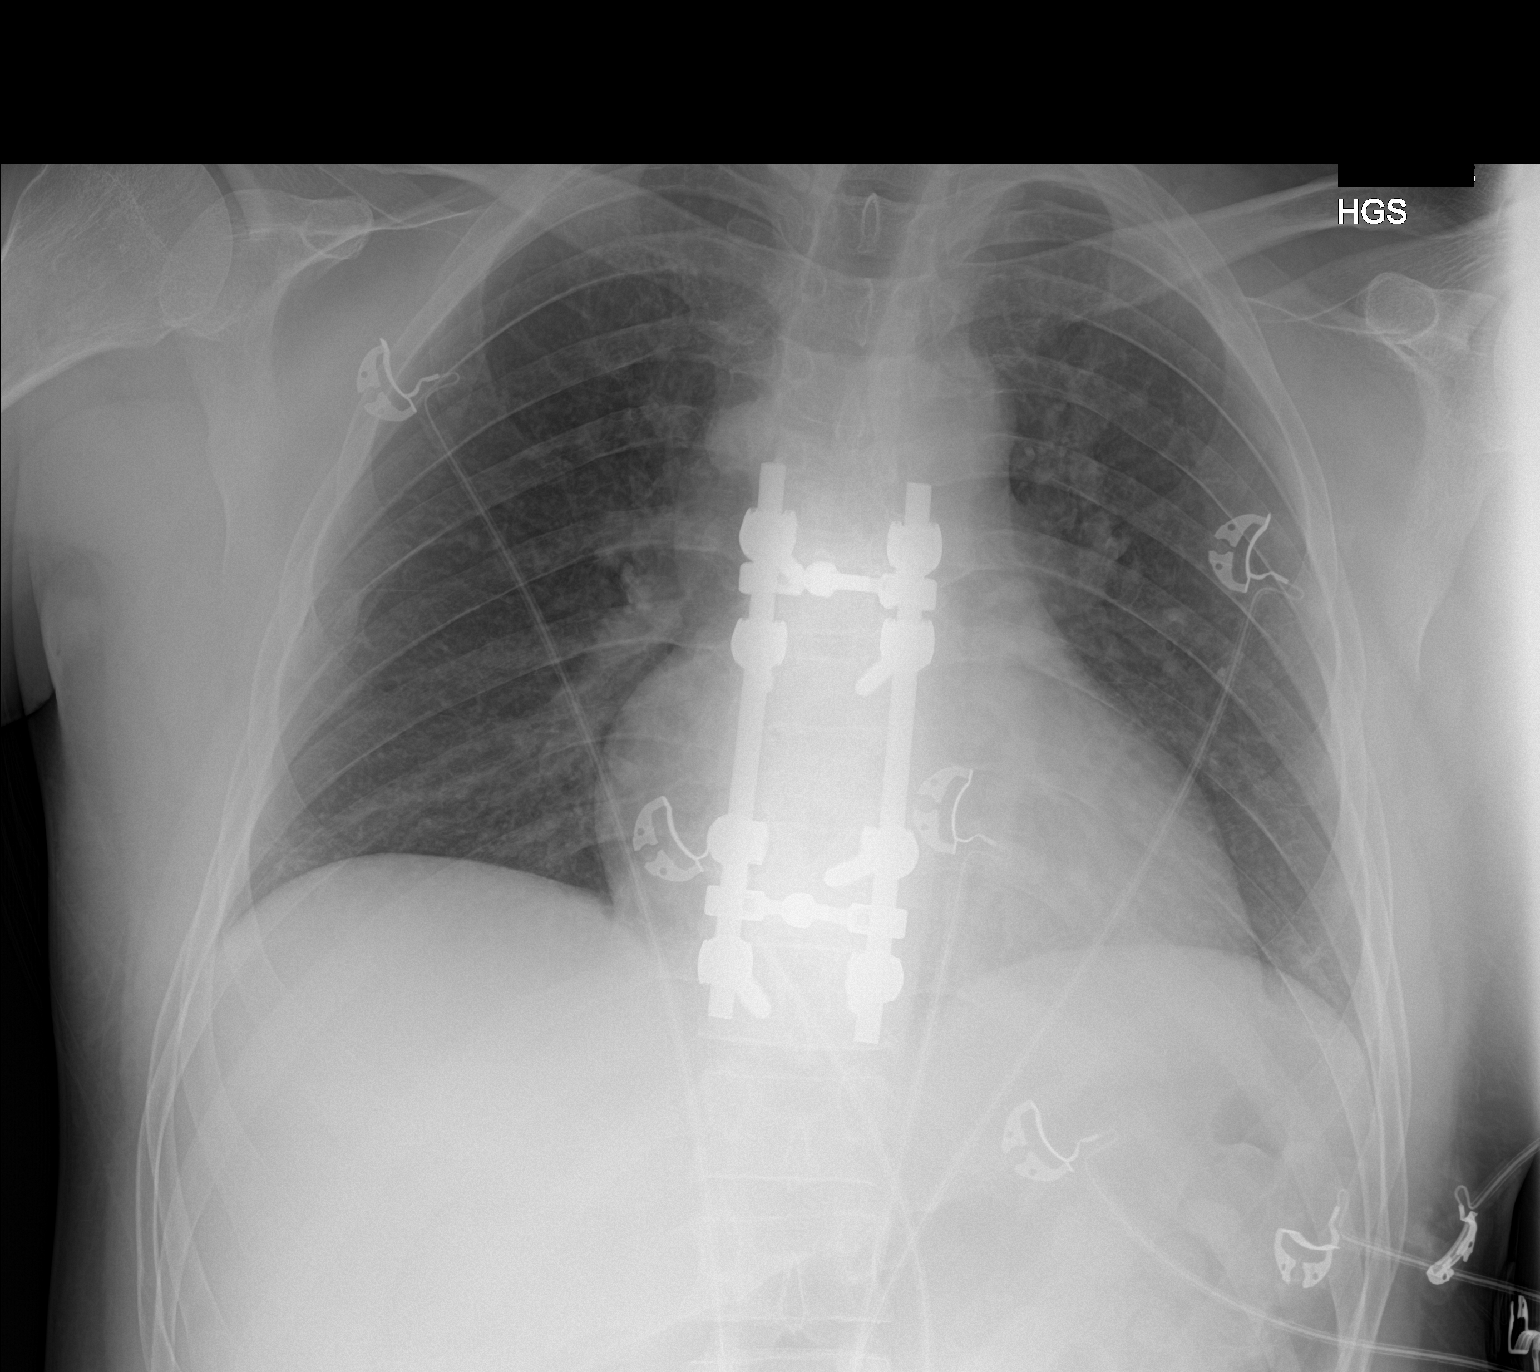

[1 of 1 positions shown; findings below may reference images not displayed]

FINDINGS: Normal heart size and pulmonary vascularity. No focal airspace
disease or consolidation in the lungs. No blunting of costophrenic
angles. No pneumothorax. Mediastinal contours appear intact. Old
right rib fractures. Postoperative changes in the lower thoracic
spine.
IMPRESSION: No active disease.

## 2016-11-15 IMAGING — CT CT HEAD W/O CM
3 of 7 series · 14 of 47 positions shown, 18 images · non-contrast
Comparison: CT head 05/04/2013

CLINICAL DATA: Patient was found unresponsive. Elevated alcohol
levels.

EXAM:
CT HEAD WITHOUT CONTRAST
CT CERVICAL SPINE WITHOUT CONTRAST
TECHNIQUE: Multidetector CT imaging of the head and cervical spine was
performed following the standard protocol without intravenous
contrast. Multiplanar CT image reconstructions of the cervical spine
were also generated.

[Series 10: sagittal bone · sagittal · 0.24mm/px · 1 of 46 slices shown, 2 images]
[im 23/46  brain]
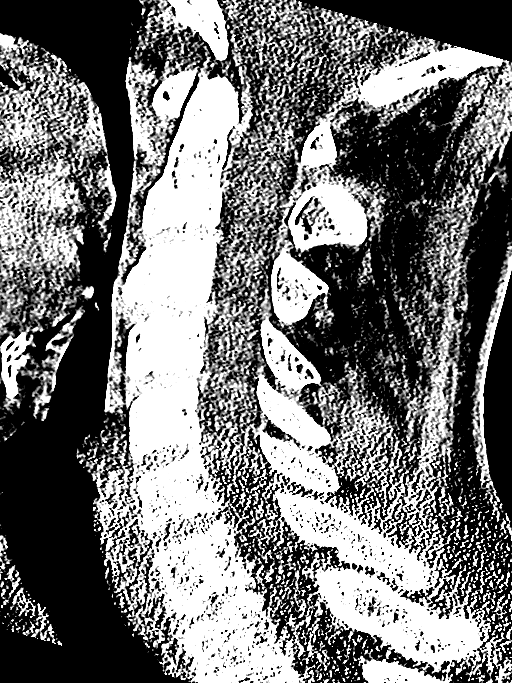
[im 23/46  bone]
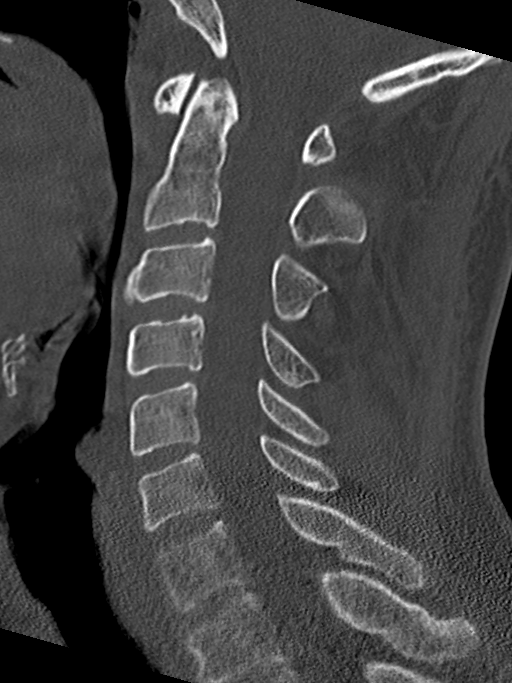

[Series 11: coronal bone · coronal · 0.24mm/px · 2 of 41 slices shown]
[im 14/41  bone]
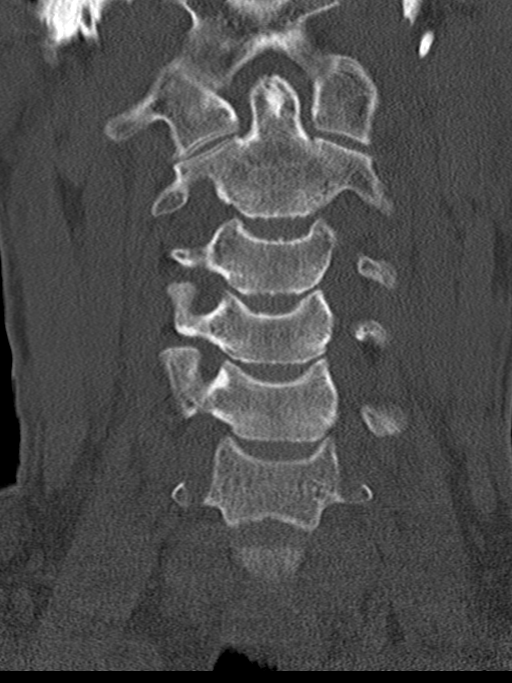
[im 27/41  bone]
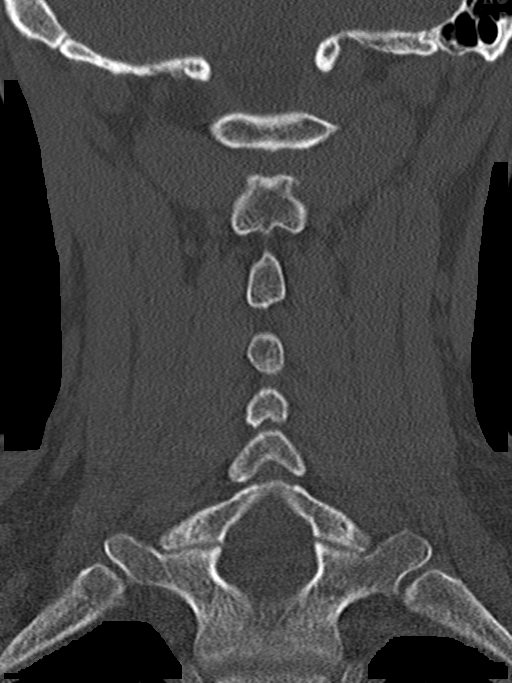

[Series 12: orthogonal bone · axial · 0.23mm/px · z∈[-266,-128]mm · 11 of 86 slices shown, 14 images]
[im 8/86  brain]
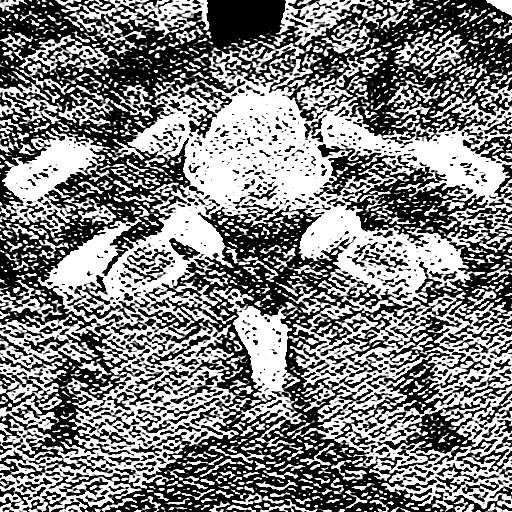
[im 8/86  bone]
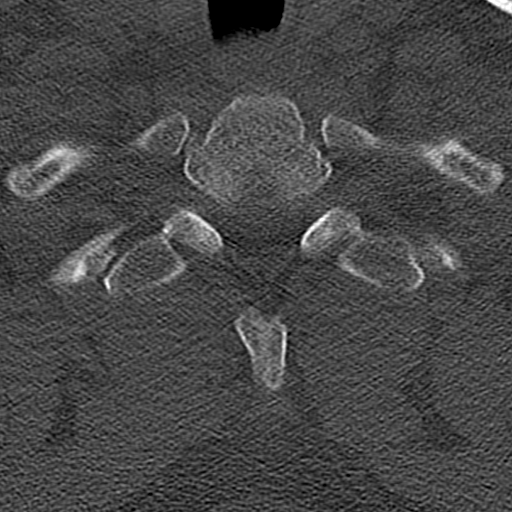
[im 15/86  bone]
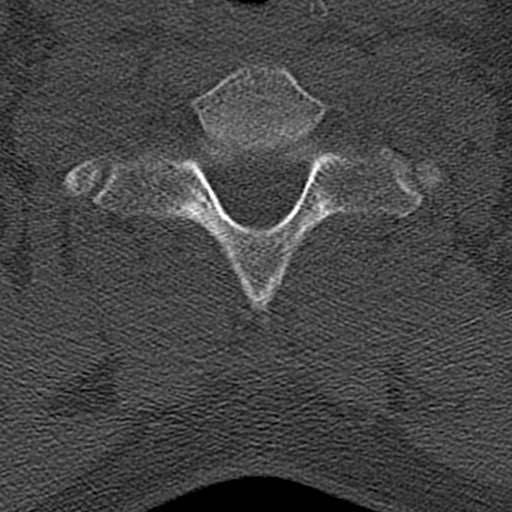
[im 22/86  bone]
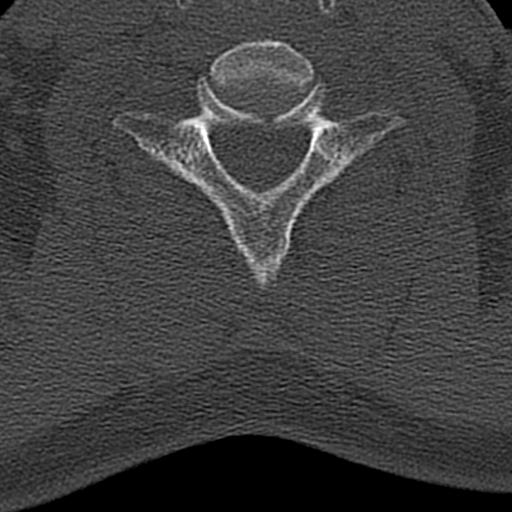
[im 29/86  bone]
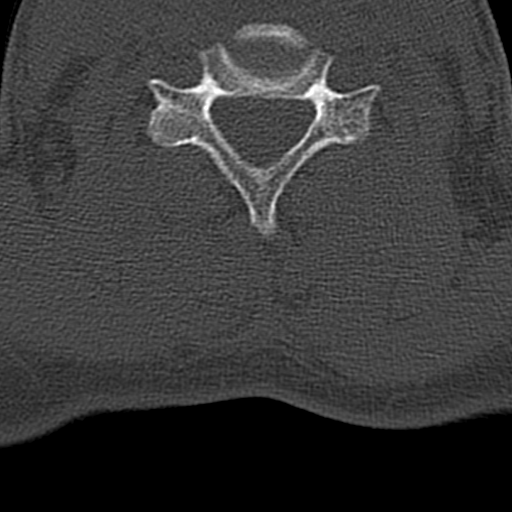
[im 36/86  brain]
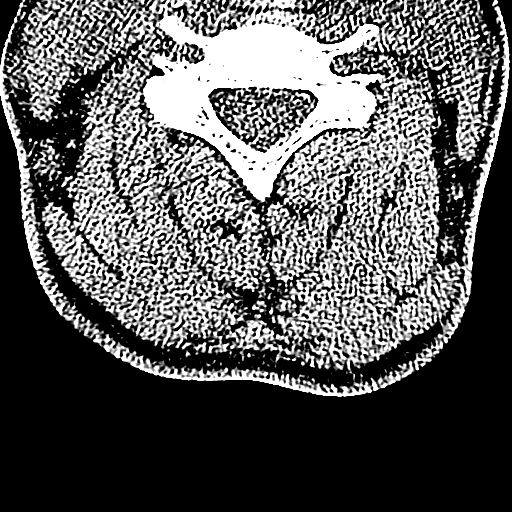
[im 36/86  bone]
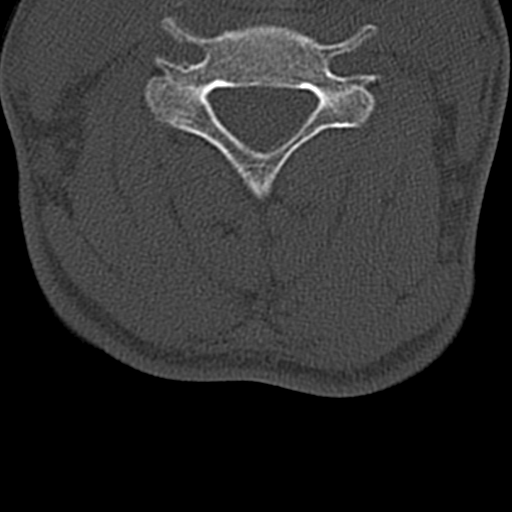
[im 43/86  bone]
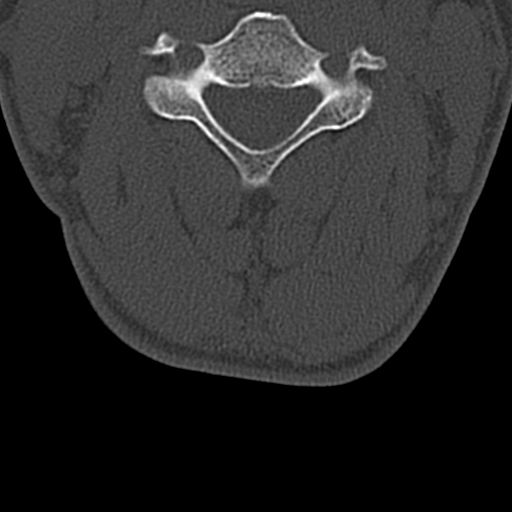
[im 50/86  bone]
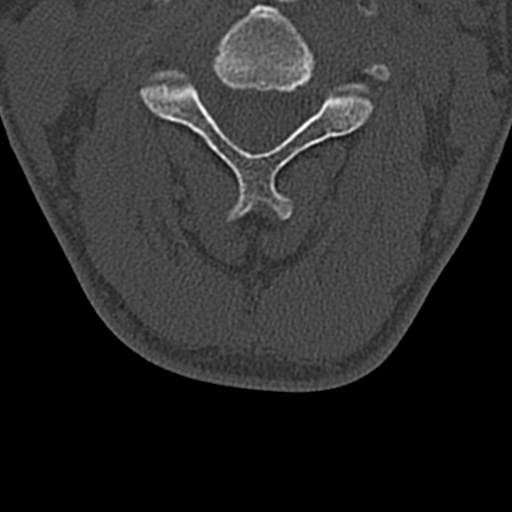
[im 57/86  bone]
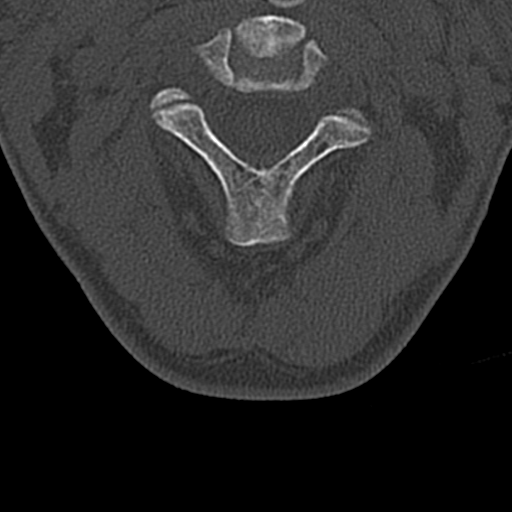
[im 64/86  brain]
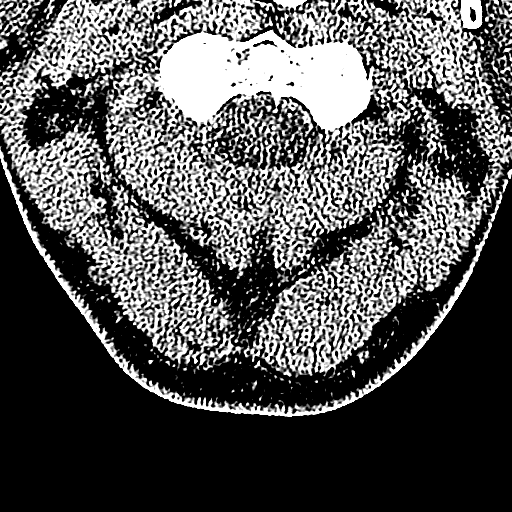
[im 64/86  bone]
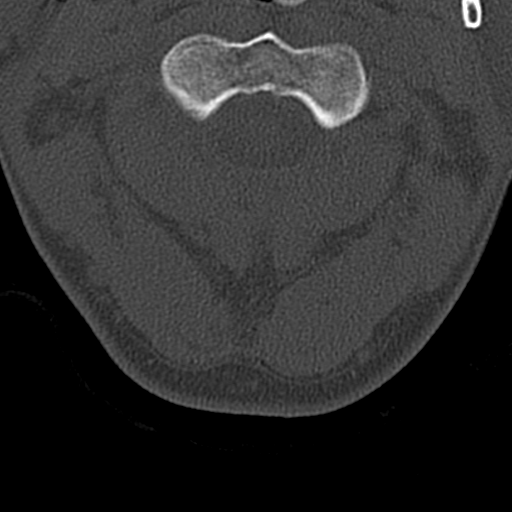
[im 71/86  bone]
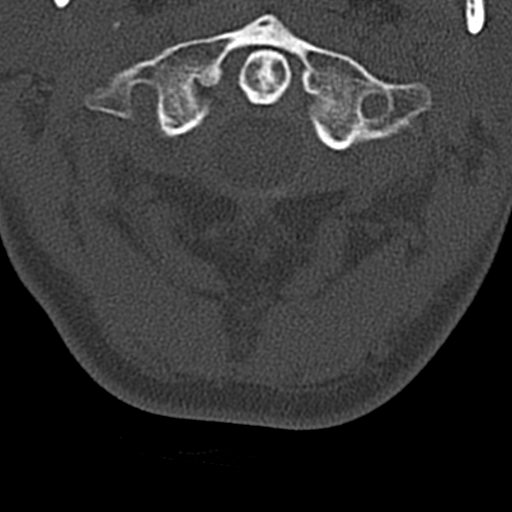
[im 78/86  bone]
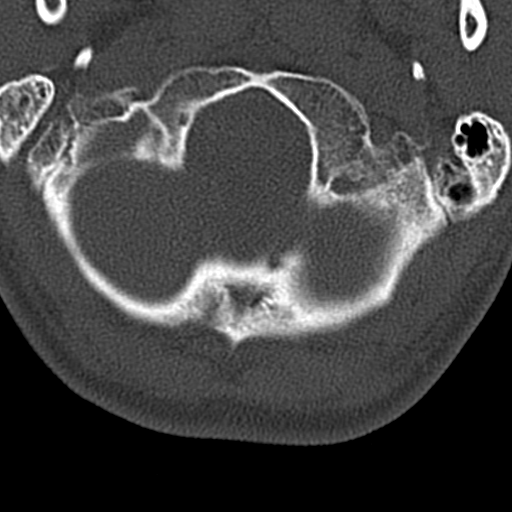

[14 of 47 positions shown; findings below may reference images not displayed]

FINDINGS: CT HEAD FINDINGS

Ventricles and sulci appear symmetrical. No ventricular dilatation.
No mass effect or midline shift. No abnormal extra-axial fluid
collections. Gray-white matter junctions are distinct. Basal
cisterns are not effaced. No evidence of acute intracranial
hemorrhage. No depressed skull fractures. Mild mucosal thickening in
the paranasal sinuses. Mastoid air cells are not opacified.

CT CERVICAL SPINE FINDINGS

Normal alignment of the cervical spine. No vertebral compression
deformities. Mild hypertrophic degenerative endplate changes at
C3-4. Intervertebral disc space heights are mostly maintained. No
prevertebral soft tissue swelling. Nasopharyngeal airway is in
place. No focal bone lesion or bone destruction. C1-2 articulation
appears intact. No significant soft tissue infiltration.
IMPRESSION: No acute intracranial abnormalities.

Normal alignment of the cervical spine. No acute displaced fractures
identified.
